# Patient Record
Sex: Female | Born: 1955
Health system: Southern US, Community
[De-identification: ages and names within clinical notes are randomized; demographics above are authoritative.]

## PROBLEM LIST (undated history)

## (undated) DIAGNOSIS — R87622 Low grade squamous intraepithelial lesion on cytologic smear of vagina (LGSIL): Secondary | ICD-10-CM

## (undated) DIAGNOSIS — N809 Endometriosis, unspecified: Secondary | ICD-10-CM

## (undated) DIAGNOSIS — N979 Female infertility, unspecified: Secondary | ICD-10-CM

## (undated) DIAGNOSIS — N3010 Interstitial cystitis (chronic) without hematuria: Secondary | ICD-10-CM

## (undated) DIAGNOSIS — F410 Panic disorder [episodic paroxysmal anxiety] without agoraphobia: Secondary | ICD-10-CM

## (undated) HISTORY — PX: DIAGNOSTIC LAPAROSCOPY: SUR761

## (undated) HISTORY — PX: OTHER SURGICAL HISTORY: SHX169

## (undated) HISTORY — DX: Female infertility, unspecified: N97.9

## (undated) HISTORY — DX: Interstitial cystitis (chronic) without hematuria: N30.10

## (undated) HISTORY — DX: Endometriosis, unspecified: N80.9

## (undated) HISTORY — DX: Low grade squamous intraepithelial lesion on cytologic smear of vagina (LGSIL): R87.622

## (undated) HISTORY — DX: Panic disorder (episodic paroxysmal anxiety): F41.0

---

## 1997-11-06 ENCOUNTER — Ambulatory Visit (HOSPITAL_COMMUNITY): Admission: RE | Admit: 1997-11-06 | Discharge: 1997-11-06 | Payer: Self-pay | Admitting: Obstetrics and Gynecology

## 1998-09-18 ENCOUNTER — Other Ambulatory Visit: Admission: RE | Admit: 1998-09-18 | Discharge: 1998-09-18 | Payer: Self-pay | Admitting: Obstetrics and Gynecology

## 1999-01-12 ENCOUNTER — Ambulatory Visit (HOSPITAL_COMMUNITY): Admission: RE | Admit: 1999-01-12 | Discharge: 1999-01-12 | Payer: Self-pay | Admitting: Obstetrics and Gynecology

## 1999-11-17 ENCOUNTER — Other Ambulatory Visit: Admission: RE | Admit: 1999-11-17 | Discharge: 1999-11-17 | Payer: Self-pay | Admitting: Obstetrics and Gynecology

## 2000-01-28 ENCOUNTER — Ambulatory Visit (HOSPITAL_COMMUNITY): Admission: RE | Admit: 2000-01-28 | Discharge: 2000-01-28 | Payer: Self-pay | Admitting: Obstetrics and Gynecology

## 2000-01-28 ENCOUNTER — Encounter: Payer: Self-pay | Admitting: Obstetrics and Gynecology

## 2000-11-01 ENCOUNTER — Other Ambulatory Visit: Admission: RE | Admit: 2000-11-01 | Discharge: 2000-11-01 | Payer: Self-pay | Admitting: Obstetrics and Gynecology

## 2001-03-30 ENCOUNTER — Ambulatory Visit (HOSPITAL_COMMUNITY): Admission: RE | Admit: 2001-03-30 | Discharge: 2001-03-30 | Payer: Self-pay | Admitting: Obstetrics and Gynecology

## 2001-03-30 ENCOUNTER — Encounter: Payer: Self-pay | Admitting: Obstetrics and Gynecology

## 2001-11-21 ENCOUNTER — Other Ambulatory Visit: Admission: RE | Admit: 2001-11-21 | Discharge: 2001-11-21 | Payer: Self-pay | Admitting: Obstetrics and Gynecology

## 2002-04-02 ENCOUNTER — Encounter: Payer: Self-pay | Admitting: Obstetrics and Gynecology

## 2002-04-02 ENCOUNTER — Ambulatory Visit (HOSPITAL_COMMUNITY): Admission: RE | Admit: 2002-04-02 | Discharge: 2002-04-02 | Payer: Self-pay | Admitting: Obstetrics and Gynecology

## 2003-04-04 ENCOUNTER — Other Ambulatory Visit: Admission: RE | Admit: 2003-04-04 | Discharge: 2003-04-04 | Payer: Self-pay | Admitting: Obstetrics and Gynecology

## 2003-05-09 ENCOUNTER — Encounter: Payer: Self-pay | Admitting: Obstetrics and Gynecology

## 2003-05-09 ENCOUNTER — Ambulatory Visit (HOSPITAL_COMMUNITY): Admission: RE | Admit: 2003-05-09 | Discharge: 2003-05-09 | Payer: Self-pay | Admitting: Obstetrics and Gynecology

## 2004-04-30 ENCOUNTER — Other Ambulatory Visit: Admission: RE | Admit: 2004-04-30 | Discharge: 2004-04-30 | Payer: Self-pay | Admitting: Obstetrics and Gynecology

## 2004-08-31 ENCOUNTER — Ambulatory Visit (HOSPITAL_COMMUNITY): Admission: RE | Admit: 2004-08-31 | Discharge: 2004-08-31 | Payer: Self-pay | Admitting: Obstetrics and Gynecology

## 2005-05-18 ENCOUNTER — Other Ambulatory Visit: Admission: RE | Admit: 2005-05-18 | Discharge: 2005-05-18 | Payer: Self-pay | Admitting: Obstetrics and Gynecology

## 2005-09-13 ENCOUNTER — Ambulatory Visit (HOSPITAL_COMMUNITY): Admission: RE | Admit: 2005-09-13 | Discharge: 2005-09-13 | Payer: Self-pay | Admitting: Obstetrics and Gynecology

## 2005-12-26 ENCOUNTER — Ambulatory Visit (HOSPITAL_COMMUNITY): Admission: RE | Admit: 2005-12-26 | Discharge: 2005-12-26 | Payer: Self-pay | Admitting: Gastroenterology

## 2005-12-26 ENCOUNTER — Encounter (INDEPENDENT_AMBULATORY_CARE_PROVIDER_SITE_OTHER): Payer: Self-pay | Admitting: Specialist

## 2006-08-16 ENCOUNTER — Other Ambulatory Visit: Admission: RE | Admit: 2006-08-16 | Discharge: 2006-08-16 | Payer: Self-pay | Admitting: Obstetrics and Gynecology

## 2006-09-14 ENCOUNTER — Ambulatory Visit (HOSPITAL_COMMUNITY): Admission: RE | Admit: 2006-09-14 | Discharge: 2006-09-14 | Payer: Self-pay | Admitting: Obstetrics and Gynecology

## 2007-08-28 ENCOUNTER — Other Ambulatory Visit: Admission: RE | Admit: 2007-08-28 | Discharge: 2007-08-28 | Payer: Self-pay | Admitting: Obstetrics and Gynecology

## 2007-09-17 ENCOUNTER — Ambulatory Visit (HOSPITAL_COMMUNITY): Admission: RE | Admit: 2007-09-17 | Discharge: 2007-09-17 | Payer: Self-pay | Admitting: Obstetrics and Gynecology

## 2008-09-03 ENCOUNTER — Other Ambulatory Visit: Admission: RE | Admit: 2008-09-03 | Discharge: 2008-09-03 | Payer: Self-pay | Admitting: Obstetrics and Gynecology

## 2008-09-18 ENCOUNTER — Ambulatory Visit (HOSPITAL_COMMUNITY): Admission: RE | Admit: 2008-09-18 | Discharge: 2008-09-18 | Payer: Self-pay | Admitting: Obstetrics and Gynecology

## 2009-09-21 ENCOUNTER — Ambulatory Visit (HOSPITAL_COMMUNITY): Admission: RE | Admit: 2009-09-21 | Discharge: 2009-09-21 | Payer: Self-pay | Admitting: Obstetrics and Gynecology

## 2010-10-06 ENCOUNTER — Ambulatory Visit (HOSPITAL_COMMUNITY)
Admission: RE | Admit: 2010-10-06 | Discharge: 2010-10-06 | Payer: Self-pay | Source: Home / Self Care | Attending: Obstetrics and Gynecology | Admitting: Obstetrics and Gynecology

## 2010-10-19 ENCOUNTER — Encounter
Admission: RE | Admit: 2010-10-19 | Discharge: 2010-10-19 | Payer: Self-pay | Source: Home / Self Care | Attending: Family Medicine | Admitting: Family Medicine

## 2011-02-16 ENCOUNTER — Ambulatory Visit
Admission: RE | Admit: 2011-02-16 | Discharge: 2011-02-16 | Disposition: A | Discharge status: Home / Self Care | Payer: BC Managed Care – PPO | Source: Ambulatory Visit | Attending: Family Medicine | Admitting: Family Medicine

## 2011-02-16 ENCOUNTER — Other Ambulatory Visit: Payer: Self-pay | Admitting: Family Medicine

## 2011-02-16 DIAGNOSIS — M79601 Pain in right arm: Secondary | ICD-10-CM

## 2011-02-18 NOTE — Op Note (Signed)
Brenda Ali, Brenda Ali                  ACCOUNT NO.:  [id number]   MEDICAL RECORD NO.:  [id number]          PATIENT TYPE:  AMB   LOCATION:  ENDO                         FACILITY:  [facility]   PHYSICIAN:  Anselmo Rod, M.D.  DATE OF BIRTH:  [birth year]   DATE OF PROCEDURE:  12/26/2005  DATE OF DISCHARGE:                                 OPERATIVE REPORT   PROCEDURE PERFORMED:  Colonoscopy with cold biopsies x 8.   ENDOSCOPIST:  Anselmo Rod, M.D.   INSTRUMENT USED:  Olympus video colonoscope.   INDICATIONS FOR PROCEDURE:  A 55 year old white female undergoing screening  colonoscopy; her father had sclerosing cholangitis.  There is no known  family history of colon cancer.   PREPROCEDURE PREPARATION:  Informed consent was procured from the patient.  The patient was fasted for four hours prior to the procedure after being  prepped with OsmoPrep pills the night of and the morning of the procedure.  The risks and benefits of the procedure including a 10% miss rate for cancer  or polyps was discussed with the patient as well.   PREPROCEDURE PHYSICAL:  The patient had stable vital signs.  Neck supple.  Chest clear to auscultation.  S1 and S2 regular.  Abdomen soft with normal  bowel sounds.   DESCRIPTION OF PROCEDURE:  The patient was placed in left lateral decubitus  position and sedated with 100 mcg of fentanyl and 10 mg of Versed in slow  incremental doses.  Once the patient was adequately sedated and maintained  on low flow oxygen and continuous cardiac monitoring, the Olympus video  colonoscope was advanced from the rectum to the cecum.  The appendicular  orifice and ileocecal valve were clearly visualized and photographed.  The  patient had a somewhat tortuous colon, and a small sessile polyp was removed  by cold biopsy x 1 from the rectosigmoid colon.  A prominent fold was  biopsied from 70 cm.  The rest of the exam was unremarkable.  Retroflexion  in the rectum revealed no  abnormalities.  The patient tolerated the  procedure well without complication.   IMPRESSION:  1.  Small polyp removed by cold biopsy from the rectosigmoid colon.  2.  Prominent fold biopsied from 70 cm.  3.  Tortuous colon but no other masses or polyps seen.   RECOMMENDATIONS:  1.  Await pathology results.  2.  Avoid all nonsteroidals including aspirin for the next two weeks.  3.  Repeat colonoscopy depending on pathology results.  4.  Outpatient followup as need arises in the future.      Anselmo Rod, M.D.  Electronically Signed     JNM/MEDQ  D:  12/26/2005  T:  12/27/2005  Job:  [id number]   cc:   Edwena Felty. Romine, M.D.  Fax: [phone]   Oley Balm Georgina Pillion, M.D.  Fax: [phone]

## 2011-02-24 ENCOUNTER — Other Ambulatory Visit: Payer: Self-pay | Admitting: Specialist

## 2011-02-24 DIAGNOSIS — M542 Cervicalgia: Secondary | ICD-10-CM

## 2011-02-25 ENCOUNTER — Ambulatory Visit
Admission: RE | Admit: 2011-02-25 | Discharge: 2011-02-25 | Disposition: A | Discharge status: Home / Self Care | Payer: BC Managed Care – PPO | Source: Ambulatory Visit | Attending: Specialist | Admitting: Specialist

## 2011-02-25 DIAGNOSIS — M542 Cervicalgia: Secondary | ICD-10-CM

## 2011-06-04 HISTORY — PX: CERVICAL FUSION: SHX112

## 2011-06-23 ENCOUNTER — Encounter (HOSPITAL_COMMUNITY)
Admission: RE | Admit: 2011-06-23 | Discharge: 2011-06-23 | Disposition: A | Discharge status: Home / Self Care | Payer: BC Managed Care – PPO | Source: Ambulatory Visit | Attending: Orthopedic Surgery | Admitting: Orthopedic Surgery

## 2011-06-23 ENCOUNTER — Other Ambulatory Visit (HOSPITAL_COMMUNITY): Payer: Self-pay | Admitting: Orthopedic Surgery

## 2011-06-23 DIAGNOSIS — M47812 Spondylosis without myelopathy or radiculopathy, cervical region: Secondary | ICD-10-CM

## 2011-06-23 LAB — SURGICAL PCR SCREEN
MRSA, PCR: POSITIVE — AB
Staphylococcus aureus: POSITIVE — AB

## 2011-06-23 LAB — CBC
Hemoglobin: 14.8 g/dL (ref 12.0–15.0)
MCH: 30.9 pg (ref 26.0–34.0)
MCHC: 34.7 g/dL (ref 30.0–36.0)
Platelets: 232 10*3/uL (ref 150–400)
RBC: 4.79 MIL/uL (ref 3.87–5.11)
RDW: 12.3 % (ref 11.5–15.5)
WBC: 8.2 10*3/uL (ref 4.0–10.5)

## 2011-06-29 ENCOUNTER — Ambulatory Visit (HOSPITAL_COMMUNITY): Payer: BC Managed Care – PPO

## 2011-06-29 ENCOUNTER — Ambulatory Visit (HOSPITAL_COMMUNITY)
Admission: RE | Admit: 2011-06-29 | Discharge: 2011-06-30 | Disposition: A | Discharge status: Home / Self Care | Payer: BC Managed Care – PPO | Source: Ambulatory Visit | Attending: Orthopedic Surgery | Admitting: Orthopedic Surgery

## 2011-06-29 DIAGNOSIS — Z01818 Encounter for other preprocedural examination: Secondary | ICD-10-CM | POA: Insufficient documentation

## 2011-06-29 DIAGNOSIS — M5020 Other cervical disc displacement, unspecified cervical region: Secondary | ICD-10-CM | POA: Insufficient documentation

## 2011-06-29 DIAGNOSIS — Z23 Encounter for immunization: Secondary | ICD-10-CM | POA: Insufficient documentation

## 2011-06-29 DIAGNOSIS — Z01812 Encounter for preprocedural laboratory examination: Secondary | ICD-10-CM | POA: Insufficient documentation

## 2011-07-06 NOTE — Op Note (Signed)
NAMEPAYSLEY, POPLAR NO.:  [id number]  MEDICAL RECORD NO.:  [id number]  LOCATION:  3536                         FACILITY:  [facility]  PHYSICIAN:  Alvy Beal, MD    DATE OF BIRTH:  May 20, 1956  DATE OF PROCEDURE:  06/29/2011 DATE OF DISCHARGE:                              OPERATIVE REPORT   PREOPERATIVE DIAGNOSIS:  C5-C6 disk herniation posterolateral to the right.  POSTOPERATIVE DIAGNOSIS:  C5-C6 disk herniation posterolateral to the right.  OPERATIVE PROCEDURE:  Anterior cervical diskectomy and fusion, C5-C6.  COMPLICATIONS:  None.  CONDITION:  Stable.  INSTRUMENTATION SYSTEM USED:  The Titan intervertebral Titanium cage 8 mm lordotic, medium, packed with DBX Mix and a 14-mm anterior cervical Synthes Vector plate with 40-JW screws into the body of C5 and 14-mm screws into the body of C6.  FIRST ASSISTANT:  Norval Gable, PA  COMPLICATIONS:  None.  HISTORY:  This is a very pleasant young woman who has been having significant progressive debilitating neck and radicular right arm pain. MRI and clinical evaluation are consistent with a C6 radiculopathy from a C5-C6 disk herniation.  Because of the failure of conservative management and the progressive deteriorating pain, she elected to proceed with surgery.  All appropriate risks, benefits, and alternatives were discussed with the patient and consent was obtained.  OPERATIVE NOTE:  The patient was brought to the operating room, placed supine on the operating table.  After successful induction of general anesthesia endotracheal intubation, TEDs and SCDs were applied.  The arms were tucked at the side and rolled towels were placed between the shoulder blades and the anterior cervical spine was prepped and draped in a standard fashion.  First assistant for the case is Norval Gable.  A time-out was then done to confirm patient, procedure, and all other pertinent important data.  Once this was completed,  fluoro was used to identify the C5-C6 disk space and it was marked.  A left-sided transverse incision was planned.  A skin incision was infiltrated with 0.25% Marcaine with epinephrine and a 3-inch incision was made centered over the C5-C6 disk space going to the left.  Sharp dissection was carried out down to the deep fascia.  The platysma was sharply incised and divided with Bovie.  I then dissected along the medial border of the sternocleidomastoid sharply and then eventually identified the omohyoid and then swept the trachea and esophagus medially and then identified and protected the carotid sheath laterally with a finger.  I then used a Pension scheme manager to remove the prevertebral fascia to completely expose the anterior cervical spine.  A needle was placed into the C5-C6 disk space.  Intraoperative x-ray confirmed that we were at the appropriate level.  Once this was done, I then used bipolar electrocautery to mobilize the longus coli muscles out to the level of the uncovertebral joints bilaterally.  Once this was done, I then placed the retracting blades underneath the longus coli muscle, deflated the endotracheal cuff, and expanded the retractors.  At this point, I clearly could visualize the C5-C6 disk space.  An annulotomy was performed with a 15 blade scalpel and then a pituitary rongeurs  used to debulk the anterior third of the disk material.  I then used a 2 and 3-mm Kerrison to trim down the overhanging osteophytes from the body of C5.  At this point, distraction pins were placed in the C5- C6 disk space and I continued my diskectomy.  Once I had the bulk of the disk material out, I was able to visualize the disk herniation posterolateral to the right.  Using a nerve hook and a 1-mm Kerrison, I teased this fragment out and removed it.  There was also a small calcific hard disk which I trimmed down.  There was a portion of it that was still quite adherent to the thecal sac.   I also undercut the uncovertebral joint to remove the bone spur with a 1-mm Kerrison.  At this point, I had adequate decompression.  I could freely pass my nerve hook underneath the endplates of C6 and C5 and there was no undue tension.  I irrigated copiously with normal saline.  I rasped the endplates and then trialed with a 7 and 8-mm medium lordotic Titan cage. The 8 gave a better fit and so, I elected to use this.  It was packed with DBX Mix and malleted to the appropriate depth.  The distraction pins were removed and then I attached the plate to the anterior spine. Self-drilling screws were used.  At this point, the wound was copiously irrigated with normal saline and used bipolar electrocautery to obtain hemostasis and maintained it with FloSeal.  I then checked to make sure the esophagus was not trapped beneath the plate which it was not and I returned the trachea and esophagus to midline.  With hemostasis achieved, I closed the platysma with interrupted 2-0 Vicryl sutures and 3-0 Monocryl for the skin.  At the end of the case, all needle and sponge counts were correct.  There were no adverse intraoperative events.  First assistant was Norval Gable, my PA.  She was instrumental in assisting with the closure, retraction, and assisting me with removing disk material with the pituitary rongeur.     Alvy Beal, MD     DDB/MEDQ  D:  06/29/2011  T:  06/29/2011  Job:  [id number]  Electronically Signed by Venita Lick MD on 07/06/2011 08:18:18 AM

## 2011-09-21 ENCOUNTER — Other Ambulatory Visit: Payer: Self-pay | Admitting: Obstetrics and Gynecology

## 2011-09-21 DIAGNOSIS — Z1231 Encounter for screening mammogram for malignant neoplasm of breast: Secondary | ICD-10-CM

## 2011-10-25 ENCOUNTER — Ambulatory Visit (HOSPITAL_COMMUNITY)
Admission: RE | Admit: 2011-10-25 | Discharge: 2011-10-25 | Disposition: A | Discharge status: Home / Self Care | Payer: BC Managed Care – PPO | Source: Ambulatory Visit | Attending: Obstetrics and Gynecology | Admitting: Obstetrics and Gynecology

## 2011-10-25 DIAGNOSIS — Z1231 Encounter for screening mammogram for malignant neoplasm of breast: Secondary | ICD-10-CM | POA: Insufficient documentation

## 2011-10-26 DIAGNOSIS — R87622 Low grade squamous intraepithelial lesion on cytologic smear of vagina (LGSIL): Secondary | ICD-10-CM

## 2011-10-26 HISTORY — DX: Low grade squamous intraepithelial lesion on cytologic smear of vagina (LGSIL): R87.622

## 2011-11-16 HISTORY — PX: COLPOSCOPY: SHX161

## 2012-10-15 ENCOUNTER — Other Ambulatory Visit: Payer: Self-pay | Admitting: Obstetrics and Gynecology

## 2012-10-15 DIAGNOSIS — Z1231 Encounter for screening mammogram for malignant neoplasm of breast: Secondary | ICD-10-CM

## 2012-11-01 ENCOUNTER — Ambulatory Visit (HOSPITAL_COMMUNITY)
Admission: RE | Admit: 2012-11-01 | Discharge: 2012-11-01 | Disposition: A | Discharge status: Home / Self Care | Payer: Managed Care, Other (non HMO) | Source: Ambulatory Visit | Attending: Obstetrics and Gynecology | Admitting: Obstetrics and Gynecology

## 2012-11-01 DIAGNOSIS — Z1231 Encounter for screening mammogram for malignant neoplasm of breast: Secondary | ICD-10-CM | POA: Insufficient documentation

## 2013-08-05 ENCOUNTER — Telehealth: Payer: Self-pay | Admitting: Obstetrics and Gynecology

## 2013-08-05 NOTE — Telephone Encounter (Signed)
Patient has a question about billing. Okay to leave a message. Patient has seen Dr. Tresa Res for years and saw Dr. Tresa Res in February. Patient was seen in [place] on 11/16/12. She does not understand why she is just now receiving a bill.

## 2013-11-13 ENCOUNTER — Other Ambulatory Visit (HOSPITAL_COMMUNITY): Payer: Self-pay | Admitting: Family Medicine

## 2013-11-13 DIAGNOSIS — Z1231 Encounter for screening mammogram for malignant neoplasm of breast: Secondary | ICD-10-CM

## 2013-11-25 ENCOUNTER — Ambulatory Visit (HOSPITAL_COMMUNITY)
Admission: RE | Admit: 2013-11-25 | Discharge: 2013-11-25 | Disposition: A | Discharge status: Home / Self Care | Payer: BC Managed Care – PPO | Source: Ambulatory Visit | Attending: Family Medicine | Admitting: Family Medicine

## 2013-11-25 DIAGNOSIS — Z1231 Encounter for screening mammogram for malignant neoplasm of breast: Secondary | ICD-10-CM | POA: Insufficient documentation

## 2013-11-26 ENCOUNTER — Encounter: Payer: Self-pay | Admitting: Obstetrics and Gynecology

## 2013-11-28 ENCOUNTER — Telehealth: Payer: Self-pay | Admitting: Gynecology

## 2013-11-28 NOTE — Telephone Encounter (Signed)
Called patient and left message to cancel her appointment for an AEX tomorrow morning due to inclement weather. Please call patient back to reschedule. I also let her know we are not opening until 10:00 AM tomorrow.

## 2013-11-29 ENCOUNTER — Encounter: Payer: Self-pay | Admitting: Gynecology

## 2013-11-29 ENCOUNTER — Ambulatory Visit (INDEPENDENT_AMBULATORY_CARE_PROVIDER_SITE_OTHER): Payer: BC Managed Care – PPO | Admitting: Gynecology

## 2013-11-29 ENCOUNTER — Ambulatory Visit: Payer: Self-pay | Admitting: Gynecology

## 2013-11-29 ENCOUNTER — Ambulatory Visit: Payer: Self-pay | Admitting: Obstetrics and Gynecology

## 2013-11-29 VITALS — BP 124/82 | HR 75 | Resp 16 | Ht 61.75 in | Wt 127.0 lb

## 2013-11-29 DIAGNOSIS — Z0000 Encounter for general adult medical examination without abnormal findings: Secondary | ICD-10-CM

## 2013-11-29 DIAGNOSIS — Z01419 Encounter for gynecological examination (general) (routine) without abnormal findings: Secondary | ICD-10-CM

## 2013-11-29 DIAGNOSIS — Z79890 Hormone replacement therapy (postmenopausal): Secondary | ICD-10-CM

## 2013-11-29 LAB — POCT URINALYSIS DIPSTICK
Bilirubin, UA: NEGATIVE
GLUCOSE UA: NEGATIVE
KETONES UA: NEGATIVE
Leukocytes, UA: NEGATIVE
Nitrite, UA: NEGATIVE
PH UA: 5
PROTEIN UA: NEGATIVE
RBC UA: NEGATIVE
UROBILINOGEN UA: NEGATIVE

## 2013-11-29 MED ORDER — ESTRADIOL 0.05 MG/24HR TD PTTW: 1.0000 | MEDICATED_PATCH | TRANSDERMAL | Status: DC

## 2013-11-29 MED ORDER — PROGESTERONE MICRONIZED 100 MG PO CAPS: 100.0000 mg | ORAL_CAPSULE | Freq: Every day | ORAL | Status: DC

## 2013-11-29 NOTE — Progress Notes (Signed)
58 y.o. married Caucasian female  G2P1001. here for annual exam. Pt is currently sexually active.  Pt is on HRT and does not have monthly bleeding.  Very happy and would like to continue.  Patient's last menstrual period was 10/13/2011.          Sexually active: yes  The current method of family planning is menopause.    Exercising: yes  tennis,walking & gym Last pap: 11-14-12 neg HPV HR neg Alcohol: 4 a week Tobacco: none BSE: yes Mammogram:  11/2013-BiRADS 1 DEXA:  never Colonoscopy:  2007   Health Maintenance  Topic Date Due  . Pap Smear  10/21/1972  . Tetanus/tdap  10/21/1973  . Colonoscopy  10/21/2004  . Influenza Vaccine  05/03/2013  . Mammogram  11/26/2015    Family History  Problem Relation Age of Onset  . Endometrial cancer Sister 60    There are no active problems to display for this patient.   Past Medical History  Diagnosis Date  . Infertility, female   . Endometriosis     Adherent R Ovary  . Panic disorder   . Interstitial cystitis   . LGSIL Pap smear of vagina 10/26/11    HPV Negative; Colpo Negative    Past Surgical History  Procedure Laterality Date  . Diagnostic laparoscopy    . Cervical fusion  06/2011  . Colposcopy  11/16/11    Due to LGSIL - HPV;  benign    Allergies: Sulfa antibiotics  Current Outpatient Prescriptions  Medication Sig Dispense Refill  . CLONAZEPAM PO Take by mouth.      . estradiol (VIVELLE-DOT) 0.05 MG/24HR patch Place 1 patch onto the skin 2 (two) times a week.      . IMIPRAMINE HCL PO Take by mouth.      . progesterone (PROMETRIUM) 100 MG capsule Take 100 mg by mouth daily.       No current facility-administered medications for this visit.    ROS: Pertinent items are noted in HPI.  Exam:    Ht 5' 1.75" (1.568 m)  Wt 127 lb (57.607 kg)  BMI 23.43 kg/m2  LMP 10/13/2011 Weight change: @WEIGHTCHANGE @ Last 3 height recordings:  Ht Readings from Last 3 Encounters:  11/29/13 5' 1.75" (1.568 m)   General appearance:  alert, cooperative and appears stated age Head: Normocephalic, without obvious abnormality, atraumatic Neck: no adenopathy, no carotid bruit, no JVD, supple, symmetrical, trachea midline and thyroid not enlarged, symmetric, no tenderness/mass/nodules Lungs: clear to auscultation bilaterally Breasts: normal appearance, no masses or tenderness Heart: regular rate and rhythm, S1, S2 normal, no murmur, click, rub or gallop Abdomen: soft, non-tender; bowel sounds normal; no masses,  no organomegaly Extremities: extremities normal, atraumatic, no cyanosis or edema Skin: Skin color, texture, turgor normal. No rashes or lesions Lymph nodes: Cervical, supraclavicular, and axillary nodes normal. no inguinal nodes palpated Neurologic: Grossly normal   Pelvic: External genitalia:  no lesions              Urethra: normal appearing urethra with no masses, tenderness or lesions              Bartholins and Skenes: normal                 Vagina: normal appearing vagina with normal color and discharge, no lesions              Cervix: normal appearance              Pap taken: no  Bimanual Exam:  Uterus:  uterus is normal size, shape, consistency and nontender                                      Adnexa:    normal adnexa in size, nontender and no masses                                      Rectovaginal: Confirms                                      Anus:  normal sphincter tone, no lesions  A: well woman no contraindication to continue hormonal therapy Contraceptive management     P: mammogram annually pap smear guidelines reviewed Refill HRT Pt will return for fasting labs counseled on breast self exam, mammography screening, use and side effects of HRT, menopause, adequate intake of calcium and vitamin D, diet and exercise return annually or prn   An After Visit Summary was printed and given to the patient.

## 2013-11-29 NOTE — Patient Instructions (Signed)

## 2013-12-02 LAB — HEMOGLOBIN, FINGERSTICK: HEMOGLOBIN, FINGERSTICK: 15.2 g/dL (ref 12.0–16.0)

## 2013-12-06 ENCOUNTER — Other Ambulatory Visit (INDEPENDENT_AMBULATORY_CARE_PROVIDER_SITE_OTHER): Payer: BC Managed Care – PPO

## 2013-12-06 DIAGNOSIS — Z0000 Encounter for general adult medical examination without abnormal findings: Secondary | ICD-10-CM

## 2013-12-06 LAB — COMPREHENSIVE METABOLIC PANEL
ALK PHOS: 69 U/L (ref 39–117)
ALT: 12 U/L (ref 0–35)
AST: 19 U/L (ref 0–37)
Albumin: 4.7 g/dL (ref 3.5–5.2)
BUN: 19 mg/dL (ref 6–23)
CO2: 28 mEq/L (ref 19–32)
Calcium: 9.7 mg/dL (ref 8.4–10.5)
Chloride: 101 mEq/L (ref 96–112)
Creat: 0.87 mg/dL (ref 0.50–1.10)
GLUCOSE: 102 mg/dL — AB (ref 70–99)
Potassium: 4.2 mEq/L (ref 3.5–5.3)
SODIUM: 138 meq/L (ref 135–145)
Total Bilirubin: 0.5 mg/dL (ref 0.2–1.2)
Total Protein: 7.2 g/dL (ref 6.0–8.3)

## 2013-12-06 LAB — LIPID PANEL
CHOL/HDL RATIO: 2.5 ratio
CHOLESTEROL: 205 mg/dL — AB (ref 0–200)
HDL: 82 mg/dL (ref >39)
LDL CALC: 112 mg/dL — AB (ref 0–99)
Triglycerides: 56 mg/dL (ref <150)
VLDL: 11 mg/dL (ref 0–40)

## 2013-12-07 LAB — VITAMIN D 25 HYDROXY (VIT D DEFICIENCY, FRACTURES): Vit D, 25-Hydroxy: 38 ng/mL (ref 30–89)

## 2013-12-18 ENCOUNTER — Telehealth: Payer: Self-pay | Admitting: *Deleted

## 2013-12-18 NOTE — Telephone Encounter (Signed)
Notified patient that her 2014 Health Provider Form has been faxed to [facility]. # [phone]  Routed to provider, encounter closed.

## 2014-08-04 ENCOUNTER — Encounter: Payer: Self-pay | Admitting: Gynecology

## 2014-10-14 ENCOUNTER — Other Ambulatory Visit: Payer: Self-pay | Admitting: Family Medicine

## 2014-10-14 DIAGNOSIS — N63 Unspecified lump in unspecified breast: Secondary | ICD-10-CM

## 2014-10-15 ENCOUNTER — Telehealth: Payer: Self-pay | Admitting: Obstetrics and Gynecology

## 2014-10-15 NOTE — Telephone Encounter (Signed)
Spoke with patient. She states she feels a change in the skin around her R breast. States area feels tough.  Noticed area about one week ago.  No redness, swelling or pain. No animal bites or trauma.   Patient declines office visit until 10/23/14. She is going out of town for birthday trip and does not want to be seen until 10/23/14. Patient is prior patient of Dr. Charlies Constable.   Patient scheduled for breast check with Regina Eck CNM for 10/23/14 at 0915. She is advised to call if any change in symptoms or would like earlier appointment.   Routing to provider for final review. Patient agreeable to disposition. Will close encounter

## 2014-10-15 NOTE — Telephone Encounter (Signed)
Patient calling to report she has "a breast mass" in her right breast. Patient hopes to come in for an appointment "sometime next week" due to going out of town for her birthday this weekend.

## 2014-10-23 ENCOUNTER — Ambulatory Visit (INDEPENDENT_AMBULATORY_CARE_PROVIDER_SITE_OTHER): Payer: BLUE CROSS/BLUE SHIELD | Admitting: Certified Nurse Midwife

## 2014-10-23 ENCOUNTER — Encounter: Payer: Self-pay | Admitting: Certified Nurse Midwife

## 2014-10-23 ENCOUNTER — Other Ambulatory Visit: Payer: Self-pay | Admitting: Certified Nurse Midwife

## 2014-10-23 VITALS — BP 124/74 | HR 78 | Resp 14 | Ht 61.75 in | Wt 122.4 lb

## 2014-10-23 DIAGNOSIS — N6310 Unspecified lump in the right breast, unspecified quadrant: Secondary | ICD-10-CM

## 2014-10-23 DIAGNOSIS — N63 Unspecified lump in unspecified breast: Secondary | ICD-10-CM

## 2014-10-23 NOTE — Progress Notes (Signed)
   Subjective:   59 y.o. MarriedCaucasian female presents for evaluation of right breast mass. Onset of the symptoms was one week. Patient sought evaluation because of fullness and size change.  Contributing factors include none. Denies fever, pain, nipple discharge but breasts feel heavy. Patient denies history of trauma, bites, or injuries. Last mammogram was 2/15.Marland Kitchen  Previous evaluation has included no workup   Review of Systems Pertinent items are noted in HPI.   Objective:   General appearance: alert, cooperative and appears stated age Breasts: normal appearance, no masses or tenderness, No nipple retraction or dimpling, No nipple discharge or bleeding, No axillary or supraclavicular adenopathy, left breast, right breast no dimpling or skin change or nipple discharge, firn ? cystic area  noted at 11 to 12 o'clock 2 fb from areola , non tender, no redness     Assessment:   ASSESSMENT:Patient is diagnosed with breast mass right   Plan:   PLAN: Discussed with patient need for evaluation with diagnostic mammogram and [place]. Will schedule.

## 2014-10-23 NOTE — Progress Notes (Signed)
Patient scheduled for right breast diagnostic and ultrasound imaging at the [facility] while in office. Appointment scheduled for 10/29/14 at 7:30am. Agreeable to date and time. Placed in mammogram hold.

## 2014-10-26 NOTE — Progress Notes (Signed)
Reviewed personally.  M. Suzanne Keanen Dohse, MD.  

## 2014-10-28 ENCOUNTER — Other Ambulatory Visit: Payer: Self-pay | Admitting: Certified Nurse Midwife

## 2014-10-28 ENCOUNTER — Other Ambulatory Visit: Payer: Self-pay

## 2014-10-28 DIAGNOSIS — N63 Unspecified lump in unspecified breast: Secondary | ICD-10-CM

## 2014-10-29 ENCOUNTER — Ambulatory Visit
Admission: RE | Admit: 2014-10-29 | Discharge: 2014-10-29 | Disposition: A | Discharge status: Home / Self Care | Payer: BLUE CROSS/BLUE SHIELD | Source: Ambulatory Visit | Attending: Certified Nurse Midwife | Admitting: Certified Nurse Midwife

## 2014-10-29 ENCOUNTER — Other Ambulatory Visit: Payer: Self-pay | Admitting: Certified Nurse Midwife

## 2014-10-29 DIAGNOSIS — N63 Unspecified lump in unspecified breast: Secondary | ICD-10-CM

## 2014-11-07 ENCOUNTER — Other Ambulatory Visit: Payer: Self-pay | Admitting: *Deleted

## 2014-11-07 DIAGNOSIS — Z79890 Hormone replacement therapy (postmenopausal): Secondary | ICD-10-CM

## 2014-11-07 MED ORDER — ESTRADIOL 0.05 MG/24HR TD PTTW: 1.0000 | MEDICATED_PATCH | TRANSDERMAL | Status: DC

## 2014-11-07 NOTE — Telephone Encounter (Signed)
Incoming fax from [facility] requesting Minivelle 0.05 mg Patch  Medication refill request: Minivelle Last AEX:  11/29/13 Next AEX: 11/20/14 Last MMG (if hormonal medication request): US Breast Axilla right BIRADS1:neg Refill authorized: 11/29/13 #24patch/3R. Today #8patch/0R?

## 2014-11-07 NOTE — Telephone Encounter (Addendum)
Left detailed message for patient at [phone]. Okay per ROI. Advised patient that Regina Eck CNM has sent a refill for [place] #[phone] to pharmacy on file. Advised to return call to office with any questions.  Cc: Emelia Salisbury  Routing to provider for final review. Patient agreeable to disposition. Will close encounter

## 2014-11-10 ENCOUNTER — Other Ambulatory Visit: Payer: Self-pay

## 2014-11-10 DIAGNOSIS — Z1231 Encounter for screening mammogram for malignant neoplasm of breast: Secondary | ICD-10-CM

## 2014-11-20 ENCOUNTER — Ambulatory Visit (INDEPENDENT_AMBULATORY_CARE_PROVIDER_SITE_OTHER): Payer: BLUE CROSS/BLUE SHIELD | Admitting: Certified Nurse Midwife

## 2014-11-20 ENCOUNTER — Encounter: Payer: Self-pay | Admitting: Certified Nurse Midwife

## 2014-11-20 VITALS — BP 94/66 | HR 80 | Ht 62.0 in | Wt 121.0 lb

## 2014-11-20 DIAGNOSIS — Z23 Encounter for immunization: Secondary | ICD-10-CM

## 2014-11-20 DIAGNOSIS — Z79890 Hormone replacement therapy (postmenopausal): Secondary | ICD-10-CM

## 2014-11-20 DIAGNOSIS — Z0000 Encounter for general adult medical examination without abnormal findings: Secondary | ICD-10-CM

## 2014-11-20 DIAGNOSIS — Z01419 Encounter for gynecological examination (general) (routine) without abnormal findings: Secondary | ICD-10-CM

## 2014-11-20 DIAGNOSIS — Z124 Encounter for screening for malignant neoplasm of cervix: Secondary | ICD-10-CM

## 2014-11-20 LAB — POCT URINALYSIS DIPSTICK
Bilirubin, UA: NEGATIVE
Glucose, UA: NEGATIVE
Ketones, UA: NEGATIVE
Leukocytes, UA: NEGATIVE
Nitrite, UA: NEGATIVE
PROTEIN UA: NEGATIVE
RBC UA: NEGATIVE
Urobilinogen, UA: NEGATIVE
pH, UA: 5

## 2014-11-20 MED ORDER — ESTRADIOL 0.05 MG/24HR TD PTTW: 1.0000 | MEDICATED_PATCH | TRANSDERMAL | Status: DC

## 2014-11-20 MED ORDER — PROGESTERONE MICRONIZED 100 MG PO CAPS: 100.0000 mg | ORAL_CAPSULE | Freq: Every day | ORAL | Status: DC

## 2014-11-20 NOTE — Progress Notes (Signed)
Patient ID: Brenda Ali, female   DOB: [birth year], 59 y.o.   MRN: [id number] 59 y.o. G60P1011 Married  Caucasian Fe here for annual exam.  Menopausal on HRT. Denies vaginal bleeding or vaginal dryness. Next month has appointment with PCP Dr. Cheron Schaumann for labs and aex. Had breast mass evaluation which showed only fibroglandular changes, patient cannot feel area now and has 3 D follow up in 2 weeks. No health concerns today.  Patient's last menstrual period was 10/13/2011.  Pt is post menopausal.      Sexually active: Yes.    The current method of family planning is post menopausal status.    Exercising: Yes.    Pt plays tennis, goes to the gym and does fast walking Smoker:  no  Health Maintenance: Pap:  11/14/12, negative with neg HR HPV, previous LGSIL with neg HR HPV in 2013 MMG:  10/29/13, right diagnostic with ultrasound, Bi-Rads 1:  Negative, routine bilateral screening scheduled for 12/04/14 Colonoscopy:  12/2005, hyperplastic polyp, repeat in 10 years BMD:   Never  TDaP:  2004 Labs:  HB:  13.7  Urine:  Negative    reports that she has never smoked. She does not have any smokeless tobacco history on file. She reports that she drinks about 2.0 oz of alcohol per week. She reports that she does not use illicit drugs.  Past Medical History  Diagnosis Date  . Infertility, female   . Endometriosis     Adherent R Ovary  . Panic disorder   . Interstitial cystitis   . LGSIL Pap smear of vagina 10/26/11    HPV Negative; Colpo Negative    Past Surgical History  Procedure Laterality Date  . Diagnostic laparoscopy    . Cervical fusion  06/2011  . Colposcopy  11/16/11    Due to LGSIL - HPV;  benign    Current Outpatient Prescriptions  Medication Sig Dispense Refill  . clonazePAM (KLONOPIN) 0.5 MG tablet daily.  0  . CLONAZEPAM PO Take by mouth daily.     Marland Kitchen estradiol (MINIVELLE) 0.05 MG/24HR patch Place 1 patch (0.05 mg total) onto the skin 2 (two) times a week. 8 patch 0  . imipramine  (TOFRANIL) 25 MG tablet daily.  0  . IMIPRAMINE HCL PO Take by mouth daily.     . progesterone (PROMETRIUM) 100 MG capsule Take 1 capsule (100 mg total) by mouth daily. 90 capsule 3   No current facility-administered medications for this visit.    Family History  Problem Relation Age of Onset  . Endometrial cancer Sister 69    ROS:  Pertinent items are noted in HPI.  Otherwise, a comprehensive ROS was negative.  Exam:   BP 94/66 mmHg  Pulse 80  Ht 5\' 2"  (1.575 m)  Wt 121 lb (54.885 kg)  BMI 22.13 kg/m2  LMP 10/13/2011 Height: 5\' 2"  (157.5 cm) Ht Readings from Last 3 Encounters:  11/20/14 5\' 2"  (1.575 m)  10/23/14 5' 1.75" (1.568 m)  11/29/13 5' 1.75" (1.568 m)    General appearance: alert, cooperative and appears stated age Head: Normocephalic, without obvious abnormality, atraumatic Neck: no adenopathy, supple, symmetrical, trachea midline and thyroid normal to inspection and palpation Lungs: clear to auscultation bilaterally Breasts: normal appearance, no masses or tenderness, No nipple retraction or dimpling, No nipple discharge or bleeding, No axillary or supraclavicular adenopathy RIght breast area of concern on 10/23/14 is not palpable now, found to fibroglandular tissue Heart: regular rate and rhythm Abdomen: soft, non-tender; no masses,  no organomegaly Extremities: extremities normal, atraumatic, no cyanosis or edema Skin: Skin color, texture, turgor normal. No rashes or lesions Lymph nodes: Cervical, supraclavicular, and axillary nodes normal. No abnormal inguinal nodes palpated Neurologic: Grossly normal   Pelvic: External genitalia:  no lesions              Urethra:  normal appearing urethra with no masses, tenderness or lesions              Bartholin's and Skene's: normal                 Vagina: normal appearing vagina with normal color and discharge, no lesions              Cervix: normal,non tender, no lesions              Pap taken: Yes.   Bimanual Exam:   Uterus:  normal size, contour, position, consistency, mobility, non-tender              Adnexa: normal adnexa and no mass, fullness, tenderness               Rectovaginal: Confirms               Anus:  normal sphincter tone, no lesions  Chaperone present:  yes  A:  Well Woman with normal exam  Menopausal on HRT requests continuance for hot flashes  Recent evaluation for right breast mass, not palpable today, benign finding has follow up 3 D mammogram in 2 weeks scheduled  Immunization update    P:   Reviewed health and wellness pertinent to exam  Discussed with patient that will need to work on decreasing HRT dosage beginning next year to be able to stop by age 59 to decrease cardiovascular risk concerns.  Rx Minivelle 0.05 mg see order  Rx Prometrium 100 mg see order  Lab Vitamin D  Requests TDAP  Pap smear taken today with HPV reflex   counseled on breast self exam, mammography screening, use and side effects of HRT, adequate intake of calcium and vitamin D, diet and exercise  return annually or prn  An After Visit Summary was printed and given to the patient.

## 2014-11-20 NOTE — Patient Instructions (Signed)

## 2014-11-21 LAB — VITAMIN D 25 HYDROXY (VIT D DEFICIENCY, FRACTURES): VIT D 25 HYDROXY: 21 ng/mL — AB (ref 30–100)

## 2014-11-21 LAB — IPS PAP TEST WITH REFLEX TO HPV

## 2014-11-23 NOTE — Progress Notes (Signed)
Reviewed personally.  M. Suzanne Mike Hamre, MD.  

## 2014-12-02 NOTE — Addendum Note (Signed)
Addended by: Regina Eck on: 12/02/2014 09:01 AM   Modules accepted: Orders, SmartSet

## 2014-12-04 ENCOUNTER — Ambulatory Visit
Admission: RE | Admit: 2014-12-04 | Discharge: 2014-12-04 | Disposition: A | Discharge status: Home / Self Care | Payer: BLUE CROSS/BLUE SHIELD | Source: Ambulatory Visit

## 2014-12-04 DIAGNOSIS — Z1231 Encounter for screening mammogram for malignant neoplasm of breast: Secondary | ICD-10-CM

## 2014-12-04 LAB — HEMOGLOBIN, FINGERSTICK: HEMOGLOBIN, FINGERSTICK: 13.7 g/dL (ref 12.0–16.0)

## 2014-12-08 LAB — HEMOGLOBIN, FINGERSTICK: Hemoglobin, fingerstick: 13.7 g/dL (ref 12.0–16.0)

## 2015-10-26 ENCOUNTER — Telehealth: Payer: Self-pay | Admitting: Certified Nurse Midwife

## 2015-10-26 NOTE — Telephone Encounter (Signed)
Patient cancelled medication consult because she is fine and will see you at aex appointment in March.

## 2015-10-29 ENCOUNTER — Ambulatory Visit: Payer: BLUE CROSS/BLUE SHIELD | Admitting: Certified Nurse Midwife

## 2015-12-03 ENCOUNTER — Ambulatory Visit: Payer: BLUE CROSS/BLUE SHIELD | Admitting: Certified Nurse Midwife

## 2015-12-05 ENCOUNTER — Other Ambulatory Visit: Payer: Self-pay | Admitting: Certified Nurse Midwife

## 2015-12-07 NOTE — Telephone Encounter (Signed)
Medication refill request: Progesterone/Estradiol  Last AEX:  11-20-14 Next AEX: 12-17-15 Last MMG (if hormonal medication request): 12-04-14 WNL Reill authorized: please advise

## 2015-12-17 ENCOUNTER — Ambulatory Visit (INDEPENDENT_AMBULATORY_CARE_PROVIDER_SITE_OTHER): Payer: BLUE CROSS/BLUE SHIELD | Admitting: Certified Nurse Midwife

## 2015-12-17 ENCOUNTER — Encounter: Payer: Self-pay | Admitting: Certified Nurse Midwife

## 2015-12-17 VITALS — BP 102/62 | HR 68 | Resp 16 | Ht 61.75 in | Wt 120.0 lb

## 2015-12-17 DIAGNOSIS — N950 Postmenopausal bleeding: Secondary | ICD-10-CM

## 2015-12-17 DIAGNOSIS — Z0000 Encounter for general adult medical examination without abnormal findings: Secondary | ICD-10-CM

## 2015-12-17 DIAGNOSIS — Z01419 Encounter for gynecological examination (general) (routine) without abnormal findings: Secondary | ICD-10-CM

## 2015-12-17 DIAGNOSIS — Z124 Encounter for screening for malignant neoplasm of cervix: Secondary | ICD-10-CM | POA: Diagnosis not present

## 2015-12-17 DIAGNOSIS — N852 Hypertrophy of uterus: Secondary | ICD-10-CM | POA: Diagnosis not present

## 2015-12-17 DIAGNOSIS — Z1211 Encounter for screening for malignant neoplasm of colon: Secondary | ICD-10-CM

## 2015-12-17 LAB — LIPID PANEL
CHOLESTEROL: 174 mg/dL (ref 125–200)
HDL: 53 mg/dL (ref >=46)
LDL Cholesterol: 100 mg/dL (ref <130)
Total CHOL/HDL Ratio: 3.3 Ratio (ref <=5.0)
Triglycerides: 107 mg/dL (ref <150)
VLDL: 21 mg/dL (ref <30)

## 2015-12-17 LAB — POCT URINALYSIS DIPSTICK
Bilirubin, UA: NEGATIVE
GLUCOSE UA: NEGATIVE
Ketones, UA: NEGATIVE
Leukocytes, UA: NEGATIVE
Nitrite, UA: NEGATIVE
PROTEIN UA: NEGATIVE
RBC UA: NEGATIVE
UROBILINOGEN UA: NEGATIVE
pH, UA: 5

## 2015-12-17 LAB — COMPREHENSIVE METABOLIC PANEL
ALBUMIN: 4.5 g/dL (ref 3.6–5.1)
ALT: 15 U/L (ref 6–29)
AST: 17 U/L (ref 10–35)
Alkaline Phosphatase: 51 U/L (ref 33–130)
BILIRUBIN TOTAL: 0.7 mg/dL (ref 0.2–1.2)
BUN: 18 mg/dL (ref 7–25)
CO2: 27 mmol/L (ref 20–31)
Calcium: 9.5 mg/dL (ref 8.6–10.4)
Chloride: 104 mmol/L (ref 98–110)
Creat: 0.98 mg/dL (ref 0.50–0.99)
Glucose, Bld: 93 mg/dL (ref 65–99)
POTASSIUM: 4.9 mmol/L (ref 3.5–5.3)
Sodium: 140 mmol/L (ref 135–146)
TOTAL PROTEIN: 7.3 g/dL (ref 6.1–8.1)

## 2015-12-17 NOTE — Progress Notes (Signed)
60 y.o. G77P1011 Married  Caucasian Fe here for annual exam. Menopausal on HRT had bleeding similar to a period dark brown for 7 days. Also had bleeding last fall but 3 days only. Had bleeding with HRT several years ago and endo biopsy with Dr Joan Flores, all normal. Sees PCP for aex, labs and medication management. Has 2 weddings coming up soon with her sons, so very busy! No other health issues today.  Patient's last menstrual period was 10/13/2011. had spotting for 1/17          Sexually active: Yes.    The current method of family planning is post menopausal status.    Exercising: Yes.    walk,gym,tennis Smoker:  no  Health Maintenance: Pap:  11-20-14 neg previous LGSIL 2013 MMG:  12-04-14 category c density,birads 1:neg  Has scheduled Colonoscopy:  12/2005,hyperplastic polyp, f/u 80yrs has scheduled BMD:   None will schedule with mammogram TDaP:  2016 Shingles: no Pneumonia: no Hep C and HIV: declines Labs: poct urine-neg Self breast exam: done monthly   reports that she has never smoked. She does not have any smokeless tobacco history on file. She reports that she drinks about 0.6 - 1.2 oz of alcohol per week. She reports that she does not use illicit drugs.  Past Medical History  Diagnosis Date  . Infertility, female   . Endometriosis     Adherent R Ovary  . Panic disorder   . Interstitial cystitis   . LGSIL Pap smear of vagina 10/26/11    HPV Negative; Colpo Negative    Past Surgical History  Procedure Laterality Date  . Diagnostic laparoscopy    . Cervical fusion  06/2011  . Colposcopy  11/16/11    Due to LGSIL - HPV;  benign    Current Outpatient Prescriptions  Medication Sig Dispense Refill  . clonazePAM (KLONOPIN) 0.5 MG tablet daily.  0  . estradiol (VIVELLE-DOT) 0.05 MG/24HR patch apply 1 patch topically two times a week 8 patch 12  . imipramine (TOFRANIL) 25 MG tablet daily.  0  . progesterone (PROMETRIUM) 100 MG capsule take 1 capsule by mouth once daily 90 capsule 4    No current facility-administered medications for this visit.    Family History  Problem Relation Age of Onset  . Endometrial cancer Sister 55    ROS:  Pertinent items are noted in HPI.  Otherwise, a comprehensive ROS was negative.  Exam:   BP 102/62 mmHg  Pulse 68  Resp 16  Ht 5' 1.75" (1.568 m)  Wt 120 lb (54.432 kg)  BMI 22.14 kg/m2  LMP 10/13/2011 Height: 5' 1.75" (156.8 cm) Ht Readings from Last 3 Encounters:  12/17/15 5' 1.75" (1.568 m)  11/20/14 5\' 2"  (1.575 m)  10/23/14 5' 1.75" (1.568 m)    General appearance: alert, cooperative and appears stated age Head: Normocephalic, without obvious abnormality, atraumatic Neck: no adenopathy, supple, symmetrical, trachea midline and thyroid normal to inspection and palpation Lungs: clear to auscultation bilaterally Breasts: normal appearance, no masses or tenderness, No nipple retraction or dimpling, No nipple discharge or bleeding, No axillary or supraclavicular adenopathy Heart: regular rate and rhythm Abdomen: soft, non-tender; no masses,  no organomegaly Extremities: extremities normal, atraumatic, no cyanosis or edema Skin: Skin color, texture, turgor normal. No rashes or lesions Lymph nodes: Cervical, supraclavicular, and axillary nodes normal. No abnormal inguinal nodes palpated Neurologic: Grossly normal   Pelvic: External genitalia:  no lesions  Urethra:  normal appearing urethra with no masses, tenderness or lesions              Bartholin's and Skene's: normal                 Vagina: normal appearing vagina with normal color and discharge, no lesions              Cervix: no cervical motion tenderness, no lesions and normal              Pap taken: Yes.   Bimanual Exam:  Uterus:  enlarged, 12 weeks size and ? adnexal mass on right              Adnexa: normal adnexa, no mass, fullness, tenderness and on left, ? right adnexal mass, vs uterus enlargement               Rectovaginal: Confirms                Anus:  normal sphincter tone, no lesions  Chaperone present: yes  A:  Well Woman with normal exam  Menopausal on HRT with 2 bleeding episodes  Enlarged uterus vs right adnexal mass  Medication management with PCP  P:   Reviewed health and wellness pertinent to exam  Discussed need for evaluation of PMB even though on HRT, which can occur.  Also discussed finding of enlarged uterus and possible fibroids, adenomyosis, or mass. Recommend PUS. Patient agreeable. Discussed also can uterine lining thickness to see if issues prior to scheduling Endo biopsy.   Labs:Vit.D, Lipid panel,CMP   Pap smear as above with HPVHR   counseled on breast self exam, mammography screening, osteoporosis, adequate intake of calcium and vitamin D, diet and exercise  return annually or prn  An After Visit Summary was printed and given to the patient.

## 2015-12-17 NOTE — Patient Instructions (Signed)

## 2015-12-18 ENCOUNTER — Telehealth: Payer: Self-pay | Admitting: Obstetrics & Gynecology

## 2015-12-18 LAB — VITAMIN D 25 HYDROXY (VIT D DEFICIENCY, FRACTURES): VIT D 25 HYDROXY: 19 ng/mL — AB (ref 30–100)

## 2015-12-18 NOTE — Telephone Encounter (Signed)
Spoke with pt regarding benefit for ultrasound. Patient understood and agreeable. Patient ready to schedule. Patient scheduled 12/24/15 with Dr Sabra Heck. Pt aware of arrival date and time. Pt aware of 72 hours cancellation policy with [id number] fee. No further questions. Ok to close

## 2015-12-18 NOTE — Progress Notes (Signed)
Encounter reviewed Brenda Brow, MD   

## 2015-12-21 LAB — IPS PAP TEST WITH REFLEX TO HPV

## 2015-12-22 ENCOUNTER — Other Ambulatory Visit: Payer: Self-pay

## 2015-12-22 DIAGNOSIS — E559 Vitamin D deficiency, unspecified: Secondary | ICD-10-CM

## 2015-12-22 MED ORDER — VITAMIN D (ERGOCALCIFEROL) 1.25 MG (50000 UNIT) PO CAPS: 50000.0000 [IU] | ORAL_CAPSULE | ORAL | Status: DC

## 2015-12-23 ENCOUNTER — Telehealth: Payer: Self-pay | Admitting: Certified Nurse Midwife

## 2015-12-23 DIAGNOSIS — N852 Hypertrophy of uterus: Secondary | ICD-10-CM

## 2015-12-23 NOTE — Telephone Encounter (Signed)
Patient calling with questions regarding her ultrasound appointment on Thursday.

## 2015-12-23 NOTE — Addendum Note (Signed)
Addended by: Michele Mcalpine on: 12/23/2015 04:49 PM   Modules accepted: Orders

## 2015-12-23 NOTE — Telephone Encounter (Signed)
Return call to patient. States her episode of vaginal bleeding was very light and has now stopped. Her pap smear was normal so she is questioning the need of pelvic ultrasound. Saw Brenda Ali for annual 12-17-15. Advised pap smear only assessing cervical cells. Explained any PMB warrants evaluation, no matter how light, this is done by pelvic ultrasound to evaluate endometrial lining, uterus, ovaries, pelvic structures above cervix.  Highly recommend she proceed with evaluation of PMB to rule out abnormal cells as cause. Advised information is not to be scary, just to stress importance of evaluation. Patient agreeable to proceed as scheduled tomorrow with Dr Sabra Heck.  Routing to provider for final review. Patient agreeable to disposition. Will close encounter.    CC: Dr Sabra Heck

## 2015-12-24 ENCOUNTER — Ambulatory Visit (INDEPENDENT_AMBULATORY_CARE_PROVIDER_SITE_OTHER): Payer: BLUE CROSS/BLUE SHIELD

## 2015-12-24 ENCOUNTER — Ambulatory Visit (INDEPENDENT_AMBULATORY_CARE_PROVIDER_SITE_OTHER): Payer: BLUE CROSS/BLUE SHIELD | Admitting: Obstetrics & Gynecology

## 2015-12-24 VITALS — BP 120/80 | HR 84 | Resp 16 | Ht 61.75 in | Wt 120.0 lb

## 2015-12-24 DIAGNOSIS — D252 Subserosal leiomyoma of uterus: Secondary | ICD-10-CM | POA: Diagnosis not present

## 2015-12-24 DIAGNOSIS — Z79890 Hormone replacement therapy (postmenopausal): Secondary | ICD-10-CM | POA: Diagnosis not present

## 2015-12-24 DIAGNOSIS — N852 Hypertrophy of uterus: Secondary | ICD-10-CM | POA: Diagnosis not present

## 2015-12-24 NOTE — Progress Notes (Signed)
60 y.o. G55P1011 Married Caucasian female here for pelvic ultrasound due to two episode of PMP bleeding in the past year, on HRT.  Pt states up front that she does not want to have to proceed with an endometrial biopsy.  She is in the process of planning for two weddings and is very busy.  Denies pelvic pain.  Reports two episodes were very light and dark in appearance and that has no cramping.  Patient's last menstrual period was 10/13/2011.  Contraception: PMP status Hormonal therapy:  Yes, on Vivelle 0.0.5mg  patches twice weekly and Prometrium 100gm daily Last Pap: 2/16, negative  Findings:  UTERUS: 9.2 x 4.8 x 4.1cm with left anterior subserosal fibroid 17 x 72mm EMS: 3.45mm, calcifications present ADNEXA: Left ovary: 1.6 x 1.0 x 1.1cm.  Imaging of this ovary was difficult due to overlying bowel/gas pattern       Right ovary: 2.2 x 1.5 x 1.4cm CUL DE SAC: no free fluid  Discussion:  Images reviewed with pt.  Endometrium <61mm so likelihood of abnormal pathology is very small.  However, do feel if bleeding continues, endometrial biopsy is warranted.  Pt voices clear understanding and states she will return if this occurs again.  Pt is thin and a regular exerciser and is a non-smoker as well.   Assessment:  Two prior episodes of PMP bleeding, 1.7cm uterine fibroid, endometrium <61mm  Plan:  Pt KNOWS to call with any additional bleeding to proceed with endometrial biopsy.  ~15 minutes spent with patient >50% of time was in face to face discussion of above.

## 2015-12-29 ENCOUNTER — Other Ambulatory Visit: Payer: Self-pay

## 2015-12-29 DIAGNOSIS — Z1231 Encounter for screening mammogram for malignant neoplasm of breast: Secondary | ICD-10-CM

## 2016-01-01 ENCOUNTER — Encounter: Payer: Self-pay | Admitting: Obstetrics & Gynecology

## 2016-01-01 DIAGNOSIS — Z79890 Hormone replacement therapy (postmenopausal): Secondary | ICD-10-CM | POA: Insufficient documentation

## 2016-01-01 DIAGNOSIS — D252 Subserosal leiomyoma of uterus: Secondary | ICD-10-CM | POA: Insufficient documentation

## 2016-01-01 HISTORY — DX: Subserosal leiomyoma of uterus: D25.2

## 2016-01-15 ENCOUNTER — Ambulatory Visit
Admission: RE | Admit: 2016-01-15 | Discharge: 2016-01-15 | Disposition: A | Discharge status: Home / Self Care | Payer: BLUE CROSS/BLUE SHIELD | Source: Ambulatory Visit

## 2016-01-15 DIAGNOSIS — Z1231 Encounter for screening mammogram for malignant neoplasm of breast: Secondary | ICD-10-CM

## 2016-01-19 ENCOUNTER — Other Ambulatory Visit: Payer: Self-pay | Admitting: Certified Nurse Midwife

## 2016-01-19 DIAGNOSIS — R928 Other abnormal and inconclusive findings on diagnostic imaging of breast: Secondary | ICD-10-CM

## 2016-01-29 ENCOUNTER — Ambulatory Visit
Admission: RE | Admit: 2016-01-29 | Discharge: 2016-01-29 | Disposition: A | Discharge status: Home / Self Care | Payer: BLUE CROSS/BLUE SHIELD | Source: Ambulatory Visit | Attending: Certified Nurse Midwife | Admitting: Certified Nurse Midwife

## 2016-01-29 DIAGNOSIS — R928 Other abnormal and inconclusive findings on diagnostic imaging of breast: Secondary | ICD-10-CM

## 2016-03-24 ENCOUNTER — Other Ambulatory Visit (INDEPENDENT_AMBULATORY_CARE_PROVIDER_SITE_OTHER): Payer: BLUE CROSS/BLUE SHIELD

## 2016-03-24 DIAGNOSIS — E559 Vitamin D deficiency, unspecified: Secondary | ICD-10-CM

## 2016-03-25 LAB — VITAMIN D 25 HYDROXY (VIT D DEFICIENCY, FRACTURES): Vit D, 25-Hydroxy: 24 ng/mL — ABNORMAL LOW (ref 30–100)

## 2016-12-02 ENCOUNTER — Other Ambulatory Visit: Payer: Self-pay | Admitting: Certified Nurse Midwife

## 2016-12-02 NOTE — Telephone Encounter (Signed)
Medication refill request: Estradiol patch  Last AEX:  12/17/15 DL Next AEX: 12/29/16 DL Last MMG (if hormonal medication request): 01/15/16 BIRADS0, Denisty C, TBC; 01/29/16 Dx & U/S L Breast BIRADS2, Benign L breast cyst, TBC Refill authorized: 12/07/15 #8 Patch 12R. Please advise. Thank you.

## 2016-12-29 ENCOUNTER — Encounter: Payer: Self-pay | Admitting: Certified Nurse Midwife

## 2016-12-29 ENCOUNTER — Ambulatory Visit (INDEPENDENT_AMBULATORY_CARE_PROVIDER_SITE_OTHER): Payer: BLUE CROSS/BLUE SHIELD | Admitting: Certified Nurse Midwife

## 2016-12-29 VITALS — BP 110/70 | HR 68 | Resp 16 | Ht 61.5 in | Wt 115.0 lb

## 2016-12-29 DIAGNOSIS — Z0000 Encounter for general adult medical examination without abnormal findings: Secondary | ICD-10-CM

## 2016-12-29 DIAGNOSIS — N951 Menopausal and female climacteric states: Secondary | ICD-10-CM

## 2016-12-29 DIAGNOSIS — Z01419 Encounter for gynecological examination (general) (routine) without abnormal findings: Secondary | ICD-10-CM | POA: Diagnosis not present

## 2016-12-29 LAB — TSH: TSH: 3.12 mIU/L

## 2016-12-29 LAB — LIPID PANEL
CHOLESTEROL: 220 mg/dL — AB (ref <200)
HDL: 93 mg/dL (ref >50)
LDL Cholesterol: 110 mg/dL — ABNORMAL HIGH (ref <100)
TRIGLYCERIDES: 85 mg/dL (ref <150)
Total CHOL/HDL Ratio: 2.4 Ratio (ref <5.0)
VLDL: 17 mg/dL (ref <30)

## 2016-12-29 MED ORDER — PROGESTERONE MICRONIZED 100 MG PO CAPS: 100.0000 mg | ORAL_CAPSULE | Freq: Every day | ORAL | 1 refills | Status: DC

## 2016-12-29 MED ORDER — ESTRADIOL 0.025 MG/24HR TD PTTW: 1.0000 | MEDICATED_PATCH | TRANSDERMAL | 3 refills | Status: DC

## 2016-12-29 NOTE — Progress Notes (Signed)
61 y.o. G14P1011 Married  Caucasian Fe here for annual exam.  Menopausal on HRT, working well.. Denies vaginal bleeding or vaginal dryness. Screening labs today. Sees Dr. Cheron Schaumann for medication management of Klonopin, Tofranil, all stable. Has colonoscopy scheduled with Dr. Collene Mares, TSH was elevated and needs recheck. No other health issues today. Having first grandchild this spring!  Patient's last menstrual period was 10/13/2011.          Sexually active: Yes.    The current method of family planning is post menopausal status.    Exercising: Yes.    tennis, gym Smoker:  no  Health Maintenance: Pap:  11-20-14 neg previous LGSIL 2013, 12-17-15 neg MMG:  4/17 bilateral & left breast u/s category c birads 2:neg Colonoscopy:  Scheduled for 5/18 with Dr. Collene Mares BMD:   none TDaP:  2018 Shingles: no Pneumonia: no Hep C and HIV: HIV negative Labs: desired, needs TSH recheck due to elevation at GI office. Self breast exam: done monthly   reports that she has never smoked. She has never used smokeless tobacco. She reports that she drinks about 1.2 - 1.8 oz of alcohol per week . She reports that she does not use drugs.  Past Medical History:  Diagnosis Date  . Endometriosis    Adherent R Ovary  . Infertility, female   . Interstitial cystitis   . LGSIL Pap smear of vagina 10/26/11   HPV Negative; Colpo Negative  . Panic disorder     Past Surgical History:  Procedure Laterality Date  . CERVICAL FUSION  06/2011  . COLPOSCOPY  11/16/11   Due to LGSIL - HPV;  benign  . DIAGNOSTIC LAPAROSCOPY      Current Outpatient Prescriptions  Medication Sig Dispense Refill  . clonazePAM (KLONOPIN) 0.5 MG tablet daily.  0  . estradiol (VIVELLE-DOT) 0.05 MG/24HR patch apply 1 patch ONTO THE SKIN two times a week 8 patch 0  . imipramine (TOFRANIL) 25 MG tablet daily.  0  . progesterone (PROMETRIUM) 100 MG capsule take 1 capsule by mouth once daily 90 capsule 4   No current facility-administered medications for  this visit.     Family History  Problem Relation Age of Onset  . Endometrial cancer Sister 12  . Other Brother     early onset of alzheimers    ROS:  Pertinent items are noted in HPI.  Otherwise, a comprehensive ROS was negative.  Exam:   BP 110/70   Pulse 68   Resp 16   Ht 5' 1.5" (1.562 m)   Wt 115 lb (52.2 kg)   LMP 10/13/2011   BMI 21.38 kg/m  Height: 5' 1.5" (156.2 cm) Ht Readings from Last 3 Encounters:  12/29/16 5' 1.5" (1.562 m)  12/24/15 5' 1.75" (1.568 m)  12/17/15 5' 1.75" (1.568 m)    General appearance: alert, cooperative and appears stated agenormal to inspection and palpation Head: Normocephalic, without obvious abnormality, atraumatic Neck: no adenopathy, supple, symmetrical, trachea midline and thyroid  Lungs: clear to auscultation bilaterally Breasts: normal appearance, no masses or tenderness, No nipple retraction or dimpling, No nipple discharge or bleeding, No axillary or supraclavicular adenopathy Heart: regular rate and rhythm Abdomen: soft, non-tender; no masses,  no organomegaly Extremities: extremities normal, atraumatic, no cyanosis or edema Skin: Skin color, texture, turgor normal. No rashes or lesions Lymph nodes: Cervical, supraclavicular, and axillary nodes normal. No abnormal inguinal nodes palpated Neurologic: Grossly normal   Pelvic: External genitalia:  no lesions  Urethra:  normal appearing urethra with no masses, tenderness or lesions              Bartholin's and Skene's: normal                 Vagina: normal appearing vagina with normal color and discharge, no lesions              Cervix: multiparous appearance, no cervical motion tenderness and no lesions              Pap taken: No. Bimanual Exam:  Uterus:  mid position with upper limits of normal size,non tender, history of fiboid              Adnexa: normal adnexa and no mass, fullness, tenderness               Rectovaginal: Confirms               Anus:  normal  sphincter tone, no lesions  Chaperone present: yes  A:  Well Woman with normal exam  Slight uterine enlargement, history of fibroid  Menopausal on HRT, planning on weaning off in next 6 months  Colonoscopy due, has scheduled with Dr. Collene Mares  BMD due will schedule with Mammogram  MD management for panic disorder  Family history of endometrial cancer sister  Screening labs   P:   Reviewed health and wellness pertinent to exam  Aware if vaginal bleeding she needs to advise  Discussed weaning off HRT and patient would like to proceed, will reduce dosage of Vivelle dot and patient to call in at 3 months with status, if doing well will taper off. Patient agreeable  Rx Vivelle dot see order with instructions  Continue follow up with PCP as indicated  Lab: TSH,Lipid panel, Hep C, Vitamin D  Pap smear as above not taken   counseled on breast self exam, mammography screening, adequate intake of calcium and vitamin D, diet and exercise  return annually or prn  An After Visit Summary was printed and given to the patient.

## 2016-12-29 NOTE — Patient Instructions (Signed)

## 2016-12-30 ENCOUNTER — Other Ambulatory Visit: Payer: Self-pay | Admitting: Certified Nurse Midwife

## 2016-12-30 LAB — HEPATITIS C ANTIBODY: HCV AB: NEGATIVE

## 2016-12-30 LAB — VITAMIN D 25 HYDROXY (VIT D DEFICIENCY, FRACTURES): Vit D, 25-Hydroxy: 24 ng/mL — ABNORMAL LOW (ref 30–100)

## 2016-12-30 NOTE — Progress Notes (Signed)
Encounter reviewed Jill Jertson, MD   

## 2017-01-03 ENCOUNTER — Other Ambulatory Visit: Payer: Self-pay | Admitting: Certified Nurse Midwife

## 2017-01-03 DIAGNOSIS — E559 Vitamin D deficiency, unspecified: Secondary | ICD-10-CM

## 2017-01-05 ENCOUNTER — Telehealth: Payer: Self-pay

## 2017-01-05 DIAGNOSIS — E789 Disorder of lipoprotein metabolism, unspecified: Secondary | ICD-10-CM

## 2017-01-05 DIAGNOSIS — E559 Vitamin D deficiency, unspecified: Secondary | ICD-10-CM

## 2017-01-05 NOTE — Telephone Encounter (Signed)
-----   Message from Regina Eck, CNM sent at 01/03/2017  7:42 AM EDT ----- Notify patient that Vitamin D is still low at 24, is she taking her Vitamin D?, Needs Rx And recheck in 3 months please schedule. Order placed. Hep. C is negative Lipid panel cholesterol is elevated at 220 and LDL is elevated at 110 HDL which is protective is normal.  Work on exercise and decrease cholesterol foods, fried foods. Increase dietary fiber daily with fresh vegetables and fruits. Needs recheck in 6  Months.,please schedule. No order placed until Vit. D drawn TSH is normal

## 2017-01-05 NOTE — Telephone Encounter (Signed)
Mailbox full

## 2017-01-06 NOTE — Telephone Encounter (Signed)
Letter sent.

## 2017-01-09 MED ORDER — VITAMIN D (ERGOCALCIFEROL) 1.25 MG (50000 UNIT) PO CAPS: 50000.0000 [IU] | ORAL_CAPSULE | ORAL | 0 refills | Status: DC

## 2017-01-09 NOTE — Telephone Encounter (Signed)
Spoke with patient, advised of results and recommendations as seen below per Melvia Heaps, CNM. Patient states she has not been taking Vitamin D daily.  Patient scheduled for 3 month Vit D lab on 7/13 at 9:15am and 6 month fasting lipid panel on 9/14 at 8:30am. Advised patient would confirm Vit D dosage and return call with recommendations, patient verbalizes understanding and is agreeable.    Kem Boroughs, NP- can you clarify Vitamin D 50,000 IU q7 days for Vitamin D of 24?  Cc: Melvia Heaps, CNM

## 2017-01-09 NOTE — Telephone Encounter (Signed)
Spoke with patient, advised as seen below per Kem Boroughs, NP. Patient states she did discuss bone density at AEX with Melvia Heaps, CNM, and discussed scheduling at next [facility]. Patient declined to schedule at this time. Patient verbalizes understanding and is agreeable.  Order placed for BMD and Vitamin D.  Routing to provider for final review. Patient is agreeable to disposition. Will close encounter.

## 2017-01-09 NOTE — Telephone Encounter (Signed)
This pt Vit D was @ 24. Our goal for her is ~ 50.   Given her age and history of degenerative cervical disk disease, it is recommended that she be on RX. Vit D 50,000 IU weekly and recheck in 3 months.  It is also recommended that we now need to do a BMD since she does have a Vit D deficiency to make sure she does not have Osteopenia.  Please get that scheduled for her as well.

## 2017-01-09 NOTE — Telephone Encounter (Signed)
Unable to leave message,mailbox full. 

## 2017-01-09 NOTE — Telephone Encounter (Signed)
Brenda Ali for your review.

## 2017-01-13 ENCOUNTER — Other Ambulatory Visit: Payer: Self-pay | Admitting: Certified Nurse Midwife

## 2017-01-13 DIAGNOSIS — Z1231 Encounter for screening mammogram for malignant neoplasm of breast: Secondary | ICD-10-CM

## 2017-02-23 ENCOUNTER — Ambulatory Visit
Admission: RE | Admit: 2017-02-23 | Discharge: 2017-02-23 | Disposition: A | Discharge status: Home / Self Care | Payer: BLUE CROSS/BLUE SHIELD | Source: Ambulatory Visit | Attending: Certified Nurse Midwife | Admitting: Certified Nurse Midwife

## 2017-02-23 ENCOUNTER — Ambulatory Visit
Admission: RE | Admit: 2017-02-23 | Discharge: 2017-02-23 | Disposition: A | Discharge status: Home / Self Care | Payer: BLUE CROSS/BLUE SHIELD | Source: Ambulatory Visit | Attending: Nurse Practitioner | Admitting: Nurse Practitioner

## 2017-02-23 DIAGNOSIS — E559 Vitamin D deficiency, unspecified: Secondary | ICD-10-CM

## 2017-02-23 DIAGNOSIS — Z1231 Encounter for screening mammogram for malignant neoplasm of breast: Secondary | ICD-10-CM

## 2017-03-01 ENCOUNTER — Telehealth: Payer: Self-pay | Admitting: Certified Nurse Midwife

## 2017-03-01 NOTE — Telephone Encounter (Signed)
Routing to [employer] CNM for review of BMD results from 02/23/2017. Results in EPIC.

## 2017-03-01 NOTE — Telephone Encounter (Signed)
Patient calling to speak with nurse about bone density results and to let Mrs. Debbi know she stopped taking the estrogen patch on Sunday.

## 2017-03-02 NOTE — Telephone Encounter (Signed)
This was in Patty's box

## 2017-03-02 NOTE — Telephone Encounter (Signed)
Spoke with patient. Advised of message as seen below from [place]. Patient verbalizes understanding.  Notes recorded by Regina Eck, CNM on 03/02/2017 at 10:56 AM EDT Notify patient that her bone density showed normal spine Right Hip only with osteopenia (low bone mass)  She needs to walk for weight bearing exercise at least 3-4 times weekly, make sure adequate calcium in diet 4 servings daily. Calcium with Vitamin D added 1200 mg daily with at least 800 IU of Vitamin D Avoid large amount of carbonated beverages which can affect absorption Repeat in 2 years  Routing to provider for final review. Patient agreeable to disposition. Will close encounter.

## 2017-03-10 ENCOUNTER — Telehealth: Payer: Self-pay | Admitting: Certified Nurse Midwife

## 2017-03-10 NOTE — Telephone Encounter (Signed)
Spoke with patient. Patient states that when she was seen with Brenda Ali CNM on 12/29/2016 her Vivelle dot dose was reduced to 0.025 mg twice weekly. Reports she recently decided she wanted to stop taking HRT. Stopped Vivelle dot and Progesterone for 1 week. Reports she had increased hot flashes, anxiety, and was very emotional. "I have a very stressful job and it was just not the right time." Patient restarted Vivelle Dot and Progesterone and reports feeling better after 4 days. Patient does not feel she can stop HRT at this time. Wanted to let Brenda Ali CNM know and report she will try again when she feels she can possible handle it.  Routing to provider for final review. Okay to close encounter?

## 2017-03-10 NOTE — Telephone Encounter (Signed)
Yes ok to close

## 2017-03-10 NOTE — Telephone Encounter (Signed)
Patient would like to give information to a nurse regarding starting her patches again.

## 2017-04-06 ENCOUNTER — Telehealth: Payer: Self-pay | Admitting: Certified Nurse Midwife

## 2017-04-06 NOTE — Telephone Encounter (Signed)
Spoke with patient. Advised patient to ensure she is able to speak with Melvia Heaps CNM about her medications on 04/14/2017 she will need an OV and labs can be performed before she leaves. Patient is agreeable. Appointment switched to 04/14/2017 at 10 am with Melvia Heaps CNM.  Routing to provider for final review. Patient agreeable to disposition. Will close encounter.

## 2017-04-06 NOTE — Telephone Encounter (Signed)
Patient has an appointment for labwork on Friday 7/13. She would like to speak with Mrs. Debbi at that appointment briefly about her medication.

## 2017-04-14 ENCOUNTER — Ambulatory Visit (INDEPENDENT_AMBULATORY_CARE_PROVIDER_SITE_OTHER): Payer: BLUE CROSS/BLUE SHIELD | Admitting: Certified Nurse Midwife

## 2017-04-14 ENCOUNTER — Other Ambulatory Visit: Payer: Self-pay

## 2017-04-14 ENCOUNTER — Encounter: Payer: Self-pay | Admitting: Certified Nurse Midwife

## 2017-04-14 VITALS — BP 92/60 | HR 60 | Resp 16 | Ht 61.5 in | Wt 119.0 lb

## 2017-04-14 DIAGNOSIS — E789 Disorder of lipoprotein metabolism, unspecified: Secondary | ICD-10-CM | POA: Diagnosis not present

## 2017-04-14 DIAGNOSIS — N951 Menopausal and female climacteric states: Secondary | ICD-10-CM | POA: Diagnosis not present

## 2017-04-14 DIAGNOSIS — Z79890 Hormone replacement therapy (postmenopausal): Secondary | ICD-10-CM | POA: Diagnosis not present

## 2017-04-14 DIAGNOSIS — E559 Vitamin D deficiency, unspecified: Secondary | ICD-10-CM | POA: Diagnosis not present

## 2017-04-14 MED ORDER — PROGESTERONE MICRONIZED 100 MG PO CAPS: 100.0000 mg | ORAL_CAPSULE | Freq: Every day | ORAL | 1 refills | Status: DC

## 2017-04-14 NOTE — Addendum Note (Signed)
Addended by: Susy Manor on: 04/14/2017 09:15 AM   Modules accepted: Orders

## 2017-04-14 NOTE — Progress Notes (Signed)
61 y.o.Married Caucasian female G2P1011here for follow-up of menopausal symptoms and HRT use. Patient was using HRT in form of Vivelle Dot .025mg /Prometrium 100 mg. She was given reduced dosage at aex in 12/29/16. Working well and decided to wean off of HRT, due to conversation of concern for long term use risks. Patient did well for a short time and then became symptomatic with anxiety feeling with hot flashes. She restarted HRT and has been feeling so much better. Would like to continue and try to wean off after the first of the year. Also here to discuss Vitamin D deficiency and management. Patient last vitamin D was 24 and is only taking Vitamin D in her calcium supplement which is 800 IU. Due for recheck today after using Rx 5000 Vitamin D once weekly x 12 weeks. Has slight osteopenia in right hip and knows this is important. No other concerns today.   O:Healthy WD,WN female, appropriately dressed    Weight:119, slim build Affect : Appropriate    A:Menopausal on HRT difficulty in weaning off, desires continuance at this point. Family history of endometrial cancer( sister 41) Osteopenia  Vitamin D deficiency recheck today Elevated cholesterol 220, LDL 110 but overall panel good, HDL 93, CHOL/HDL ratio 2.4  P:Discussed risks/benefits of HRT continuance and concerns with uterine/breast cancer, cardiovascular changes and [facility] information. Discussed benefits for her with osteopenia and still having some symptoms(flushing). Patient and provider agree she is good candidate to continue low dose HRT at this time and work with cessation in 2019. Will update RX. Labs: Vitamin D, Lipid panel Discussed vitamin D and calcium importance with prevention of osteoporosis and role of weight bearing exercise daily. Questions addressed at length  RV prn/aex  29 minutes of time in consultation regarding HRT use and vitamin d deficiency.

## 2017-04-15 LAB — VITAMIN D 25 HYDROXY (VIT D DEFICIENCY, FRACTURES): VIT D 25 HYDROXY: 36.3 ng/mL (ref 30.0–100.0)

## 2017-04-23 ENCOUNTER — Other Ambulatory Visit: Payer: Self-pay | Admitting: Certified Nurse Midwife

## 2017-04-23 DIAGNOSIS — N951 Menopausal and female climacteric states: Secondary | ICD-10-CM

## 2017-04-24 NOTE — Telephone Encounter (Signed)
Medication refill request: Estradiol 0.025 mg/24hr Last AEX:  12/29/16 DL Next AEX: 01/04/18 Last MMG (if hormonal medication request): 02/23/17 BIRADS 1 negative/density c Refill authorized: 12/29/16 #8patch w/3 refills; today please advise; DL out of office today

## 2017-04-27 ENCOUNTER — Other Ambulatory Visit: Payer: Self-pay | Admitting: Certified Nurse Midwife

## 2017-04-27 DIAGNOSIS — N951 Menopausal and female climacteric states: Secondary | ICD-10-CM

## 2017-04-27 NOTE — Telephone Encounter (Signed)
Medication refill request: estradiol  Last AEX:  12/29/16 DL Next AEX: 01/04/18 DL Last MMG (if hormonal medication request): 02/24/17 BIRADS1:neg  Refill authorized: 04/24/17 #8patch/8R to [facility]

## 2017-06-14 ENCOUNTER — Other Ambulatory Visit: Payer: Self-pay

## 2017-06-15 ENCOUNTER — Other Ambulatory Visit: Payer: Self-pay

## 2017-06-16 ENCOUNTER — Other Ambulatory Visit: Payer: Self-pay

## 2017-06-29 ENCOUNTER — Other Ambulatory Visit (INDEPENDENT_AMBULATORY_CARE_PROVIDER_SITE_OTHER): Payer: BLUE CROSS/BLUE SHIELD

## 2017-06-29 DIAGNOSIS — E789 Disorder of lipoprotein metabolism, unspecified: Secondary | ICD-10-CM | POA: Diagnosis not present

## 2017-06-30 DIAGNOSIS — Z23 Encounter for immunization: Secondary | ICD-10-CM | POA: Diagnosis not present

## 2017-06-30 DIAGNOSIS — M542 Cervicalgia: Secondary | ICD-10-CM | POA: Diagnosis not present

## 2017-06-30 DIAGNOSIS — F419 Anxiety disorder, unspecified: Secondary | ICD-10-CM | POA: Diagnosis not present

## 2017-06-30 LAB — LIPID PANEL
Chol/HDL Ratio: 2.4 ratio (ref 0.0–4.4)
Cholesterol, Total: 203 mg/dL — ABNORMAL HIGH (ref 100–199)
HDL: 84 mg/dL (ref >39)
LDL Calculated: 98 mg/dL (ref 0–99)
TRIGLYCERIDES: 104 mg/dL (ref 0–149)
VLDL Cholesterol Cal: 21 mg/dL (ref 5–40)

## 2017-07-31 DIAGNOSIS — M542 Cervicalgia: Secondary | ICD-10-CM | POA: Diagnosis not present

## 2017-08-03 DIAGNOSIS — M542 Cervicalgia: Secondary | ICD-10-CM | POA: Diagnosis not present

## 2017-08-11 DIAGNOSIS — Z09 Encounter for follow-up examination after completed treatment for conditions other than malignant neoplasm: Secondary | ICD-10-CM | POA: Diagnosis not present

## 2017-08-11 DIAGNOSIS — M542 Cervicalgia: Secondary | ICD-10-CM | POA: Diagnosis not present

## 2017-08-21 DIAGNOSIS — M542 Cervicalgia: Secondary | ICD-10-CM | POA: Diagnosis not present

## 2017-10-06 DIAGNOSIS — Z1211 Encounter for screening for malignant neoplasm of colon: Secondary | ICD-10-CM | POA: Diagnosis not present

## 2017-10-27 DIAGNOSIS — H31001 Unspecified chorioretinal scars, right eye: Secondary | ICD-10-CM | POA: Diagnosis not present

## 2017-10-27 DIAGNOSIS — H5213 Myopia, bilateral: Secondary | ICD-10-CM | POA: Diagnosis not present

## 2017-10-27 DIAGNOSIS — H04123 Dry eye syndrome of bilateral lacrimal glands: Secondary | ICD-10-CM | POA: Diagnosis not present

## 2017-10-27 DIAGNOSIS — H524 Presbyopia: Secondary | ICD-10-CM | POA: Diagnosis not present

## 2017-12-03 ENCOUNTER — Other Ambulatory Visit: Payer: Self-pay | Admitting: Certified Nurse Midwife

## 2017-12-03 DIAGNOSIS — N951 Menopausal and female climacteric states: Secondary | ICD-10-CM

## 2017-12-04 NOTE — Telephone Encounter (Signed)
Medication refill request: Progesterone 100mg  #90 Last AEX:  12-29-16 Next AEX: 01-04-18 Last MMG (if hormonal medication request): 02-23-17 Neg/BiRads1 Refill authorized: Please advise

## 2017-12-29 DIAGNOSIS — N3010 Interstitial cystitis (chronic) without hematuria: Secondary | ICD-10-CM | POA: Diagnosis not present

## 2017-12-29 DIAGNOSIS — F419 Anxiety disorder, unspecified: Secondary | ICD-10-CM | POA: Diagnosis not present

## 2018-01-04 ENCOUNTER — Encounter: Payer: Self-pay | Admitting: Certified Nurse Midwife

## 2018-01-04 ENCOUNTER — Other Ambulatory Visit: Payer: Self-pay | Admitting: Obstetrics and Gynecology

## 2018-01-04 ENCOUNTER — Ambulatory Visit: Payer: BLUE CROSS/BLUE SHIELD | Admitting: Certified Nurse Midwife

## 2018-01-04 ENCOUNTER — Other Ambulatory Visit: Payer: Self-pay

## 2018-01-04 ENCOUNTER — Other Ambulatory Visit (HOSPITAL_COMMUNITY)
Admission: RE | Admit: 2018-01-04 | Discharge: 2018-01-04 | Disposition: A | Discharge status: Home / Self Care | Payer: BLUE CROSS/BLUE SHIELD | Source: Ambulatory Visit | Attending: Certified Nurse Midwife | Admitting: Certified Nurse Midwife

## 2018-01-04 VITALS — BP 102/60 | HR 60 | Resp 16 | Ht 61.5 in | Wt 120.0 lb

## 2018-01-04 DIAGNOSIS — Z0000 Encounter for general adult medical examination without abnormal findings: Secondary | ICD-10-CM | POA: Diagnosis not present

## 2018-01-04 DIAGNOSIS — N951 Menopausal and female climacteric states: Secondary | ICD-10-CM

## 2018-01-04 DIAGNOSIS — Z124 Encounter for screening for malignant neoplasm of cervix: Secondary | ICD-10-CM | POA: Diagnosis not present

## 2018-01-04 DIAGNOSIS — E559 Vitamin D deficiency, unspecified: Secondary | ICD-10-CM

## 2018-01-04 DIAGNOSIS — Z01419 Encounter for gynecological examination (general) (routine) without abnormal findings: Secondary | ICD-10-CM

## 2018-01-04 LAB — POCT URINALYSIS DIPSTICK
BILIRUBIN UA: NEGATIVE
Glucose, UA: NEGATIVE
KETONES UA: NEGATIVE
Leukocytes, UA: NEGATIVE
Nitrite, UA: NEGATIVE
PH UA: 5 (ref 5.0–8.0)
Protein, UA: NEGATIVE
RBC UA: NEGATIVE
UROBILINOGEN UA: NEGATIVE U/dL — AB

## 2018-01-04 MED ORDER — ESTRADIOL 0.025 MG/24HR TD PTTW: 1.0000 | MEDICATED_PATCH | TRANSDERMAL | 12 refills | Status: DC

## 2018-01-04 MED ORDER — PROGESTERONE MICRONIZED 100 MG PO CAPS: 100.0000 mg | ORAL_CAPSULE | Freq: Every day | ORAL | 4 refills | Status: DC

## 2018-01-04 NOTE — Patient Instructions (Signed)

## 2018-01-04 NOTE — Progress Notes (Signed)
62 y.o. G7P1011 Married  Caucasian Fe here for annual exam. Menopausal continues on HRT. Desires continuance. Denies any health issues other the last year. Has a new grandchild! Discussing retirement in the next year to enjoy family. Sees PCP for medication management of Klonopin.. Continues to work now and has no health concerns today. Planning a vacation soon!  Patient's last menstrual period was 10/13/2011.          Sexually active: Yes.    The current method of family planning is post menopausal status.    Exercising: Yes.    tennis, walking Smoker:  no  Health Maintenance: Pap:  12-17-15 neg History of Abnormal Pap: yes MMG:  02-23-17 category c density birads 1:neg Self Breast exams: occ Colonoscopy:  1/19 negative BMD:   2018 normal TDaP:  2018 Shingles: no Pneumonia: no Hep C and HIV: Hep c neg 2018 Labs: poct urine-neg   reports that she has never smoked. She has never used smokeless tobacco. She reports that she drinks about 1.2 - 1.8 oz of alcohol per week. She reports that she does not use drugs.  Past Medical History:  Diagnosis Date  . Endometriosis    Adherent R Ovary  . Infertility, female   . Interstitial cystitis   . LGSIL Pap smear of vagina 10/26/11   HPV Negative; Colpo Negative  . Panic disorder     Past Surgical History:  Procedure Laterality Date  . CERVICAL FUSION  06/2011  . COLPOSCOPY  11/16/11   Due to LGSIL - HPV;  benign  . DIAGNOSTIC LAPAROSCOPY      Current Outpatient Medications  Medication Sig Dispense Refill  . clonazePAM (KLONOPIN) 0.5 MG tablet daily.  0  . estradiol (VIVELLE-DOT) 0.025 MG/24HR apply 1 patch ONTO THE SKIN two times a week 8 patch 8  . imipramine (TOFRANIL) 25 MG tablet daily.  0  . progesterone (PROMETRIUM) 100 MG capsule TAKE 1 CAPSULE BY MOUTH ONCE DAILY 90 capsule 0   No current facility-administered medications for this visit.     Family History  Problem Relation Age of Onset  . Endometrial cancer Sister 24  .  Other Brother        early onset of alzheimers    ROS:  Pertinent items are noted in HPI.  Otherwise, a comprehensive ROS was negative.  Exam:   LMP 10/13/2011    Ht Readings from Last 3 Encounters:  04/14/17 5' 1.5" (1.562 m)  12/29/16 5' 1.5" (1.562 m)  12/24/15 5' 1.75" (1.568 m)    General appearance: alert, cooperative and appears stated age Head: Normocephalic, without obvious abnormality, atraumatic Neck: no adenopathy, supple, symmetrical, trachea midline and thyroid normal to inspection and palpation Lungs: clear to auscultation bilaterally Breasts: normal appearance, no masses or tenderness, No nipple retraction or dimpling, No nipple discharge or bleeding, No axillary or supraclavicular adenopathy Heart: regular rate and rhythm Abdomen: soft, non-tender; no masses,  no organomegaly Extremities: extremities normal, atraumatic, no cyanosis or edema Skin: Skin color, texture, turgor normal. No rashes or lesions Lymph nodes: Cervical, supraclavicular, and axillary nodes normal. No abnormal inguinal nodes palpated Neurologic: Grossly normal   Pelvic: External genitalia:  no lesions              Urethra:  normal appearing urethra with no masses, tenderness or lesions              Bartholin's and Skene's: normal  Vagina: normal appearing vagina with normal color and discharge, no lesions              Cervix: no cervical motion tenderness, no lesions and normal appearance              Pap taken: Yes.   Bimanual Exam:  Uterus:  normal size, contour, position, consistency, mobility, non-tender and anteverted, slight enlargement noted, but  history of fibroid              Adnexa: normal adnexa and no mass, fullness, tenderness               Rectovaginal: Confirms               Anus:  normal sphincter tone, no lesions  Chaperone present: yes  A:  Well Woman with normal exam  Menopausal on HRT and desires continuation  Mammogram due in 5/19 will  schedule  Enlarged uterus history of fibroid  Continue follow up with MD as needed.  Screening labs  P:   Reviewed health and wellness pertinent to exam  Discussed risks/benefits/warning signs of HRT and current recommendation for use for symptomatic relief of menopause. Discussed she has done well on lower dosage after trying to stop last year. Patient desires continuance and voiced understanding of information of risks and warning signs. Provider and patient agree continuing is a good choice at this time and patient will advise if any change in health status.  Rx Vivelle dot see order with instructions  Rx Prometrium see order with instructions  No change from previous exam.  Labs: CMP,Lipid panel, TSH, CBC,Vitamin D (has form to be filled out  Pap smear: no   counseled on breast self exam, mammography screening, use and side effects of HRT, adequate intake of calcium and vitamin D, diet and exercise  return annually or prn  An After Visit Summary was printed and given to the patient.

## 2018-01-04 NOTE — Addendum Note (Signed)
Addended by: Regina Eck on: 01/04/2018 02:34 PM   Modules accepted: Orders

## 2018-01-05 LAB — COMPREHENSIVE METABOLIC PANEL
A/G RATIO: 1.9 (ref 1.2–2.2)
ALK PHOS: 67 IU/L (ref 39–117)
ALT: 11 IU/L (ref 0–32)
AST: 19 IU/L (ref 0–40)
Albumin: 5 g/dL — ABNORMAL HIGH (ref 3.6–4.8)
BILIRUBIN TOTAL: 0.6 mg/dL (ref 0.0–1.2)
BUN / CREAT RATIO: 15 (ref 12–28)
BUN: 15 mg/dL (ref 8–27)
CHLORIDE: 102 mmol/L (ref 96–106)
CO2: 22 mmol/L (ref 20–29)
Calcium: 9.8 mg/dL (ref 8.7–10.3)
Creatinine, Ser: 1 mg/dL (ref 0.57–1.00)
GFR calc Af Amer: 69 mL/min/{1.73_m2} (ref >59)
GFR calc non Af Amer: 60 mL/min/{1.73_m2} (ref >59)
GLOBULIN, TOTAL: 2.6 g/dL (ref 1.5–4.5)
Glucose: 86 mg/dL (ref 65–99)
Potassium: 4.5 mmol/L (ref 3.5–5.2)
SODIUM: 140 mmol/L (ref 134–144)
Total Protein: 7.6 g/dL (ref 6.0–8.5)

## 2018-01-05 LAB — TSH: TSH: 3.61 u[IU]/mL (ref 0.450–4.500)

## 2018-01-05 LAB — CBC
Hematocrit: 46.9 % — ABNORMAL HIGH (ref 34.0–46.6)
Hemoglobin: 15.4 g/dL (ref 11.1–15.9)
MCH: 29.6 pg (ref 26.6–33.0)
MCHC: 32.8 g/dL (ref 31.5–35.7)
MCV: 90 fL (ref 79–97)
Platelets: 246 10*3/uL (ref 150–379)
RBC: 5.2 x10E6/uL (ref 3.77–5.28)
RDW: 13.4 % (ref 12.3–15.4)
WBC: 9 10*3/uL (ref 3.4–10.8)

## 2018-01-05 LAB — VITAMIN D 25 HYDROXY (VIT D DEFICIENCY, FRACTURES): VIT D 25 HYDROXY: 24.2 ng/mL — AB (ref 30.0–100.0)

## 2018-01-05 LAB — LIPID PANEL
CHOLESTEROL TOTAL: 235 mg/dL — AB (ref 100–199)
Chol/HDL Ratio: 2.7 ratio (ref 0.0–4.4)
HDL: 87 mg/dL (ref >39)
LDL Calculated: 126 mg/dL — ABNORMAL HIGH (ref 0–99)
TRIGLYCERIDES: 112 mg/dL (ref 0–149)
VLDL CHOLESTEROL CAL: 22 mg/dL (ref 5–40)

## 2018-01-08 LAB — CYTOLOGY - PAP
Diagnosis: NEGATIVE
HPV (WINDOPATH): NOT DETECTED

## 2018-02-19 ENCOUNTER — Other Ambulatory Visit: Payer: Self-pay | Admitting: Certified Nurse Midwife

## 2018-02-19 DIAGNOSIS — Z1231 Encounter for screening mammogram for malignant neoplasm of breast: Secondary | ICD-10-CM

## 2018-03-15 ENCOUNTER — Ambulatory Visit
Admission: RE | Admit: 2018-03-15 | Discharge: 2018-03-15 | Disposition: A | Discharge status: Home / Self Care | Payer: BLUE CROSS/BLUE SHIELD | Source: Ambulatory Visit | Attending: Certified Nurse Midwife | Admitting: Certified Nurse Midwife

## 2018-03-15 DIAGNOSIS — Z1231 Encounter for screening mammogram for malignant neoplasm of breast: Secondary | ICD-10-CM | POA: Diagnosis not present

## 2018-04-06 DIAGNOSIS — L239 Allergic contact dermatitis, unspecified cause: Secondary | ICD-10-CM | POA: Diagnosis not present

## 2018-07-12 DIAGNOSIS — Z23 Encounter for immunization: Secondary | ICD-10-CM | POA: Diagnosis not present

## 2018-07-12 DIAGNOSIS — F419 Anxiety disorder, unspecified: Secondary | ICD-10-CM | POA: Diagnosis not present

## 2018-10-03 DIAGNOSIS — J029 Acute pharyngitis, unspecified: Secondary | ICD-10-CM | POA: Diagnosis not present

## 2018-10-03 DIAGNOSIS — J209 Acute bronchitis, unspecified: Secondary | ICD-10-CM | POA: Diagnosis not present

## 2018-10-11 DIAGNOSIS — R590 Localized enlarged lymph nodes: Secondary | ICD-10-CM | POA: Diagnosis not present

## 2018-10-27 DIAGNOSIS — R05 Cough: Secondary | ICD-10-CM | POA: Diagnosis not present

## 2018-10-27 DIAGNOSIS — J029 Acute pharyngitis, unspecified: Secondary | ICD-10-CM | POA: Diagnosis not present

## 2018-11-02 DIAGNOSIS — H8110 Benign paroxysmal vertigo, unspecified ear: Secondary | ICD-10-CM | POA: Diagnosis not present

## 2018-11-02 DIAGNOSIS — J069 Acute upper respiratory infection, unspecified: Secondary | ICD-10-CM | POA: Diagnosis not present

## 2018-11-30 DIAGNOSIS — H52203 Unspecified astigmatism, bilateral: Secondary | ICD-10-CM | POA: Diagnosis not present

## 2018-11-30 DIAGNOSIS — H2513 Age-related nuclear cataract, bilateral: Secondary | ICD-10-CM | POA: Diagnosis not present

## 2018-11-30 DIAGNOSIS — H5213 Myopia, bilateral: Secondary | ICD-10-CM | POA: Diagnosis not present

## 2018-11-30 DIAGNOSIS — H31001 Unspecified chorioretinal scars, right eye: Secondary | ICD-10-CM | POA: Diagnosis not present

## 2019-01-01 ENCOUNTER — Other Ambulatory Visit: Payer: Self-pay | Admitting: Certified Nurse Midwife

## 2019-01-01 DIAGNOSIS — N951 Menopausal and female climacteric states: Secondary | ICD-10-CM

## 2019-01-01 NOTE — Telephone Encounter (Signed)
Medication refill request: vivelle dot  Last AEX:  01/04/18 DL Next AEX: 01/25/19 DL Last MMG (if hormonal medication request): 03/15/18  Refill authorized: 01/04/18 #8/12R. Today please advise.

## 2019-01-25 ENCOUNTER — Other Ambulatory Visit: Payer: Self-pay | Admitting: Certified Nurse Midwife

## 2019-01-25 ENCOUNTER — Ambulatory Visit: Payer: BLUE CROSS/BLUE SHIELD | Admitting: Certified Nurse Midwife

## 2019-01-25 DIAGNOSIS — N951 Menopausal and female climacteric states: Secondary | ICD-10-CM

## 2019-01-28 NOTE — Telephone Encounter (Signed)
Medication refill request: progesterone  Last AEX:  01/04/18 DL Next AEX: none Last MMG (if hormonal medication request): 03/15/18 BIRADS1:Neg.  Refill authorized: 01/04/18 #90/4R. Today please advise.

## 2019-03-20 ENCOUNTER — Other Ambulatory Visit: Payer: Self-pay | Admitting: Certified Nurse Midwife

## 2019-03-20 DIAGNOSIS — Z1231 Encounter for screening mammogram for malignant neoplasm of breast: Secondary | ICD-10-CM

## 2019-03-22 ENCOUNTER — Ambulatory Visit
Admission: RE | Admit: 2019-03-22 | Discharge: 2019-03-22 | Disposition: A | Discharge status: Home / Self Care | Payer: BC Managed Care – PPO | Source: Ambulatory Visit | Attending: Certified Nurse Midwife | Admitting: Certified Nurse Midwife

## 2019-03-22 ENCOUNTER — Other Ambulatory Visit: Payer: Self-pay

## 2019-03-22 DIAGNOSIS — Z1231 Encounter for screening mammogram for malignant neoplasm of breast: Secondary | ICD-10-CM | POA: Diagnosis not present

## 2019-03-27 ENCOUNTER — Other Ambulatory Visit: Payer: Self-pay | Admitting: *Deleted

## 2019-03-27 DIAGNOSIS — N951 Menopausal and female climacteric states: Secondary | ICD-10-CM

## 2019-03-27 MED ORDER — ESTRADIOL 0.025 MG/24HR TD PTTW: MEDICATED_PATCH | TRANSDERMAL | 0 refills | Status: DC

## 2019-03-27 NOTE — Telephone Encounter (Signed)
Medication refill request: Estradiol patch  Last AEX:  01-04-18 DL  Next AEX: 04-08-2019 Last MMG (if hormonal medication request): 03-22-2019 BIRADS 1 negative  Refill authorized: 01-01-2019 #24, 0RF. Today, please advise.   Medication pended for #8, 0RF. Please refill if appropriate.

## 2019-04-03 NOTE — Progress Notes (Addendum)
63 y.o. G49P1011 Married  Caucasian Fe here for annual exam. Post menopausal on Vivelle Dot/Prometrium working well desires continuance. Has finally decided on retirement. Would like to stop her HRT, after she is in a healthy routine with walking and exercising. Needs labs for insurance screening. Sees PCP Dr.Walters prn. No health issues today. Excited about retirement.  Patient's last menstrual period was 10/13/2011.          Sexually active: Yes.    The current method of family planning is post menopausal status.    Exercising: Yes.    walking, tennis Smoker:  no  Review of Systems  Constitutional: Negative.   HENT: Negative.   Eyes: Negative.   Respiratory: Negative.   Cardiovascular: Negative.   Gastrointestinal: Negative.   Genitourinary: Negative.   Musculoskeletal: Negative.   Skin: Negative.   Neurological: Negative.   Endo/Heme/Allergies: Negative.   Psychiatric/Behavioral: Negative.     Health Maintenance: Pap:  12-17-15 neg, 01-04-18 neg HPV HR neg History of Abnormal Pap: yes MMG:  03-22-2019 category c density birads 1:neg Self Breast exams: occ Colonoscopy:  1/19 neg BMD:   2018 normal TDaP:  2018 Shingles: no Pneumonia: no Hep C and HIV: hep c neg 2018 Labs: poct urine-neg   reports that she has never smoked. She has never used smokeless tobacco. She reports current alcohol use of about 2.0 - 3.0 standard drinks of alcohol per week. She reports that she does not use drugs.  Past Medical History:  Diagnosis Date  . Endometriosis    Adherent R Ovary  . Infertility, female   . Interstitial cystitis   . LGSIL Pap smear of vagina 10/26/11   HPV Negative; Colpo Negative  . Panic disorder     Past Surgical History:  Procedure Laterality Date  . CERVICAL FUSION  06/2011  . COLPOSCOPY  11/16/11   Due to LGSIL - HPV;  benign  . DIAGNOSTIC LAPAROSCOPY      Current Outpatient Medications  Medication Sig Dispense Refill  . clonazePAM (KLONOPIN) 0.5 MG tablet  daily.  0  . estradiol (VIVELLE-DOT) 0.025 MG/24HR One patch twice weekly 8 patch 0  . imipramine (TOFRANIL) 25 MG tablet daily.  0  . progesterone (PROMETRIUM) 100 MG capsule TAKE 1 CAPSULE(100 MG) BY MOUTH DAILY 90 capsule 0   No current facility-administered medications for this visit.     Family History  Problem Relation Age of Onset  . Endometrial cancer Sister 88  . Other Brother        early onset of alzheimers    ROS:  Pertinent items are noted in HPI.  Otherwise, a comprehensive ROS was negative.  Exam:   BP 110/70   Pulse 68   Temp (!) 97.4 F (36.3 C) (Skin)   Resp 16   Ht 5' 1.75" (1.568 m)   Wt 126 lb (57.2 kg)   LMP 10/13/2011   BMI 23.23 kg/m  Height: 5' 1.75" (156.8 cm) Ht Readings from Last 3 Encounters:  04/08/19 5' 1.75" (1.568 m)  01/04/18 5' 1.5" (1.562 m)  04/14/17 5' 1.5" (1.562 m)    General appearance: alert, cooperative and appears stated age Head: Normocephalic, without obvious abnormality, atraumatic Neck: no adenopathy, supple, symmetrical, trachea midline and thyroid normal to inspection and palpation Lungs: clear to auscultation bilaterally Breasts: normal appearance, no masses or tenderness, No nipple retraction or dimpling, No nipple discharge or bleeding, No axillary or supraclavicular adenopathy Heart: regular rate and rhythm Abdomen: soft, non-tender; no masses,  no  organomegaly Extremities: extremities normal, atraumatic, no cyanosis or edema Skin: Skin color, texture, turgor normal. No rashes or lesions Lymph nodes: Cervical, supraclavicular, and axillary nodes normal. No abnormal inguinal nodes palpated Neurologic: Grossly normal   Pelvic: External genitalia:  no lesions              Urethra:  normal appearing urethra with no masses, tenderness or lesions              Bartholin's and Skene's: normal                 Vagina: normal appearing vagina with normal color and discharge, no lesions              Cervix: no cervical  motion tenderness, no lesions and normal appearance              Pap taken: No. Bimanual Exam:  Uterus:  normal size, contour, position, consistency, mobility, non-tender and anteverted              Adnexa: normal adnexa and no mass, fullness, tenderness               Rectovaginal: Confirms               Anus:  normal sphincter tone, no lesions  Chaperone present: yes  A:  Well Woman with normal exam  Post menopausal on HRT encouraged to wean off  PCP management of Klonopin, working well  Screening labs  P:   Reviewed health and wellness pertinent to exam  Discussed risks/benefits/warning signs with HRT increase risk with use long term. She is aware and plans to wean off. Instructions given to cut patch in half and wear as usual for 2 weeks and then wean to one half weekly and wean off after 2-4 weeks.Continue Prometrium daily as directed, until stops patch. Will renew Rx and patient will advise if problems with stopping.  Rx Vivelle -Dot, Rx Prometrium see order with instructions  Continue follow up with PCP as indicated  Labs: Vitamin D, TSH, Lipid panel, CMP, CBC  Pap smear: no   counseled on breast self exam, mammography screening, feminine hygiene, menopause, osteoporosis, adequate intake of calcium and vitamin D, diet and exercise  return annually or prn  Note received from  patient weaned off HRT end of 09/2019  An After Visit Summary was printed and given to the patient.

## 2019-04-08 ENCOUNTER — Encounter: Payer: Self-pay | Admitting: Certified Nurse Midwife

## 2019-04-08 ENCOUNTER — Ambulatory Visit: Payer: BC Managed Care – PPO | Admitting: Certified Nurse Midwife

## 2019-04-08 ENCOUNTER — Other Ambulatory Visit: Payer: Self-pay

## 2019-04-08 VITALS — BP 110/70 | HR 68 | Temp 97.4°F | Resp 16 | Ht 61.75 in | Wt 126.0 lb

## 2019-04-08 DIAGNOSIS — N951 Menopausal and female climacteric states: Secondary | ICD-10-CM

## 2019-04-08 DIAGNOSIS — Z01419 Encounter for gynecological examination (general) (routine) without abnormal findings: Secondary | ICD-10-CM

## 2019-04-08 DIAGNOSIS — E559 Vitamin D deficiency, unspecified: Secondary | ICD-10-CM

## 2019-04-08 DIAGNOSIS — Z0000 Encounter for general adult medical examination without abnormal findings: Secondary | ICD-10-CM | POA: Diagnosis not present

## 2019-04-08 LAB — POCT URINALYSIS DIPSTICK
Bilirubin, UA: NEGATIVE
Blood, UA: NEGATIVE
Glucose, UA: NEGATIVE
Ketones, UA: NEGATIVE
Leukocytes, UA: NEGATIVE
Nitrite, UA: NEGATIVE
Protein, UA: NEGATIVE
Urobilinogen, UA: NEGATIVE E.U./dL — AB
pH, UA: 5 (ref 5.0–8.0)

## 2019-04-08 MED ORDER — PROGESTERONE MICRONIZED 100 MG PO CAPS: ORAL_CAPSULE | ORAL | 1 refills | Status: DC

## 2019-04-08 MED ORDER — ESTRADIOL 0.025 MG/24HR TD PTTW: MEDICATED_PATCH | TRANSDERMAL | 6 refills | Status: DC

## 2019-04-09 LAB — COMPREHENSIVE METABOLIC PANEL
ALT: 9 IU/L (ref 0–32)
AST: 24 IU/L (ref 0–40)
Albumin/Globulin Ratio: 1.8 (ref 1.2–2.2)
Albumin: 4.5 g/dL (ref 3.8–4.8)
Alkaline Phosphatase: 77 IU/L (ref 39–117)
BUN/Creatinine Ratio: 21 (ref 12–28)
BUN: 19 mg/dL (ref 8–27)
Bilirubin Total: 0.5 mg/dL (ref 0.0–1.2)
CO2: 23 mmol/L (ref 20–29)
Calcium: 9.5 mg/dL (ref 8.7–10.3)
Chloride: 99 mmol/L (ref 96–106)
Creatinine, Ser: 0.9 mg/dL (ref 0.57–1.00)
GFR calc Af Amer: 78 mL/min/{1.73_m2} (ref >59)
GFR calc non Af Amer: 68 mL/min/{1.73_m2} (ref >59)
Globulin, Total: 2.5 g/dL (ref 1.5–4.5)
Glucose: 82 mg/dL (ref 65–99)
Potassium: 4.3 mmol/L (ref 3.5–5.2)
Sodium: 137 mmol/L (ref 134–144)
Total Protein: 7 g/dL (ref 6.0–8.5)

## 2019-04-09 LAB — LIPID PANEL
Chol/HDL Ratio: 2.6 ratio (ref 0.0–4.4)
Cholesterol, Total: 214 mg/dL — ABNORMAL HIGH (ref 100–199)
HDL: 81 mg/dL (ref >39)
LDL Calculated: 111 mg/dL — ABNORMAL HIGH (ref 0–99)
Triglycerides: 109 mg/dL (ref 0–149)
VLDL Cholesterol Cal: 22 mg/dL (ref 5–40)

## 2019-04-09 LAB — CBC
Hematocrit: 42.9 % (ref 34.0–46.6)
Hemoglobin: 13.9 g/dL (ref 11.1–15.9)
MCH: 29 pg (ref 26.6–33.0)
MCHC: 32.4 g/dL (ref 31.5–35.7)
MCV: 90 fL (ref 79–97)
Platelets: 239 10*3/uL (ref 150–450)
RBC: 4.79 x10E6/uL (ref 3.77–5.28)
RDW: 12.5 % (ref 11.7–15.4)
WBC: 5.8 10*3/uL (ref 3.4–10.8)

## 2019-04-09 LAB — TSH: TSH: 4.22 u[IU]/mL (ref 0.450–4.500)

## 2019-04-09 LAB — VITAMIN D 25 HYDROXY (VIT D DEFICIENCY, FRACTURES): Vit D, 25-Hydroxy: 27.7 ng/mL — ABNORMAL LOW (ref 30.0–100.0)

## 2019-04-10 ENCOUNTER — Other Ambulatory Visit: Payer: Self-pay | Admitting: Certified Nurse Midwife

## 2019-04-10 DIAGNOSIS — E559 Vitamin D deficiency, unspecified: Secondary | ICD-10-CM

## 2019-04-12 DIAGNOSIS — N3010 Interstitial cystitis (chronic) without hematuria: Secondary | ICD-10-CM | POA: Diagnosis not present

## 2019-04-12 DIAGNOSIS — F419 Anxiety disorder, unspecified: Secondary | ICD-10-CM | POA: Diagnosis not present

## 2019-06-10 DIAGNOSIS — Z03818 Encounter for observation for suspected exposure to other biological agents ruled out: Secondary | ICD-10-CM | POA: Diagnosis not present

## 2019-06-21 DIAGNOSIS — Z832 Family history of diseases of the blood and blood-forming organs and certain disorders involving the immune mechanism: Secondary | ICD-10-CM | POA: Diagnosis not present

## 2019-06-21 DIAGNOSIS — Z23 Encounter for immunization: Secondary | ICD-10-CM | POA: Diagnosis not present

## 2019-06-25 ENCOUNTER — Telehealth: Payer: Self-pay | Admitting: Certified Nurse Midwife

## 2019-06-25 NOTE — Telephone Encounter (Signed)
Would recommend staying on 1/2 patch weekly until these symptoms improve and then work once weekly. This may take 2- 3 weeks. Continue prometriium

## 2019-06-25 NOTE — Telephone Encounter (Signed)
Patient left voicemail during lunch stating that she has questions for a nurse regarding coming off of progestin.

## 2019-06-25 NOTE — Telephone Encounter (Signed)
Spoke with patient. Patient has started to wean off of HRT as discussed at 04/08/19 AEX. She is in her 2nd week of 1/2 patch twice weekly, taking Prometrium daily.    Patient reports since she has started to wean she is having trouble staying asleep and and feeling "weepy" at times. Denies any other symptoms. Patient states she feels now is the time to come off of the HRT.   Patient is asking if she continue 1/2 patch twice weekly? Or other recommendations?   Melvia Heaps, CNM -please advise.

## 2019-06-25 NOTE — Telephone Encounter (Signed)
Spoke with patient, advised as seen below per Deborah Leonard, CNM. Patient verbalizes understanding and is agreeable.   Encounter closed.  

## 2019-06-30 DIAGNOSIS — J209 Acute bronchitis, unspecified: Secondary | ICD-10-CM | POA: Diagnosis not present

## 2019-09-24 DIAGNOSIS — M5412 Radiculopathy, cervical region: Secondary | ICD-10-CM

## 2019-09-24 DIAGNOSIS — M542 Cervicalgia: Secondary | ICD-10-CM | POA: Diagnosis not present

## 2019-09-24 DIAGNOSIS — Z981 Arthrodesis status: Secondary | ICD-10-CM

## 2019-09-24 DIAGNOSIS — F419 Anxiety disorder, unspecified: Secondary | ICD-10-CM

## 2019-09-24 HISTORY — DX: Anxiety disorder, unspecified: F41.9

## 2019-09-24 HISTORY — DX: Arthrodesis status: Z98.1

## 2019-09-24 HISTORY — DX: Radiculopathy, cervical region: M54.12

## 2019-10-08 ENCOUNTER — Telehealth: Payer: Self-pay | Admitting: Certified Nurse Midwife

## 2019-10-08 DIAGNOSIS — E559 Vitamin D deficiency, unspecified: Secondary | ICD-10-CM

## 2019-10-08 NOTE — Telephone Encounter (Signed)
Per review of Epic, VIt D on 04/04/19 was 27.7. OTC Vit D 1000 IU daily was recommended, recheck in 6 mo.   Spoke with patient, advised as seen above. Patient agreeable to r/s lab appt. Lab scheduled for 10/14/19 at 10:55am. Lab order placed.   Patient would like Melvia Heaps, CNM to know she has weaned herself off of Prometrium and estradiol patch, is doing well.   Routing to provider for final review. Patient is agreeable to disposition. Will close encounter.

## 2019-10-08 NOTE — Telephone Encounter (Signed)
Patient cancelled upcoming lab appointment for 10/11/2019. Would like to know what labs it was for before rescheduling. Please advise.

## 2019-10-09 DIAGNOSIS — M5412 Radiculopathy, cervical region: Secondary | ICD-10-CM | POA: Diagnosis not present

## 2019-10-11 ENCOUNTER — Other Ambulatory Visit: Payer: BC Managed Care – PPO

## 2019-10-11 DIAGNOSIS — M542 Cervicalgia: Secondary | ICD-10-CM | POA: Diagnosis not present

## 2019-10-14 ENCOUNTER — Telehealth: Payer: Self-pay | Admitting: *Deleted

## 2019-10-14 ENCOUNTER — Other Ambulatory Visit (INDEPENDENT_AMBULATORY_CARE_PROVIDER_SITE_OTHER): Payer: PPO

## 2019-10-14 ENCOUNTER — Other Ambulatory Visit: Payer: Self-pay

## 2019-10-14 DIAGNOSIS — E559 Vitamin D deficiency, unspecified: Secondary | ICD-10-CM

## 2019-10-14 NOTE — Telephone Encounter (Signed)
Spoke with patient while in office for labs. Patient wants to report to Melvia Heaps, CNM that she has successfully weaned off of all of her HRT. She is doinf well, may have had 1-2 hot flashes. Has been off for 3 wks now. Patient is aware to call the office if any future concerns.   Routing to provider for final review. Patient is agreeable to disposition. Will close encounter.

## 2019-10-15 ENCOUNTER — Telehealth: Payer: Self-pay

## 2019-10-15 LAB — VITAMIN D 25 HYDROXY (VIT D DEFICIENCY, FRACTURES): Vit D, 25-Hydroxy: 27 ng/mL — ABNORMAL LOW (ref 30.0–100.0)

## 2019-10-15 NOTE — Telephone Encounter (Signed)
Patient notified of results. See lab 

## 2019-10-15 NOTE — Telephone Encounter (Signed)
Left message for call back.

## 2019-10-15 NOTE — Telephone Encounter (Signed)
-----   Message from Regina Eck, CNM sent at 10/15/2019  7:34 AM EST ----- Notify patient her Vitamin D is still low. Is she not taking 2000 IU Vitamin D 3 daily. She should start on this to bring to better range for menopausal bone support.

## 2019-11-01 ENCOUNTER — Ambulatory Visit: Payer: BC Managed Care – PPO

## 2019-11-04 DIAGNOSIS — F419 Anxiety disorder, unspecified: Secondary | ICD-10-CM | POA: Diagnosis not present

## 2019-11-04 DIAGNOSIS — M5090 Cervical disc disorder, unspecified, unspecified cervical region: Secondary | ICD-10-CM | POA: Diagnosis not present

## 2019-11-06 ENCOUNTER — Ambulatory Visit: Payer: BC Managed Care – PPO

## 2019-11-09 ENCOUNTER — Ambulatory Visit: Payer: PPO | Attending: Internal Medicine

## 2019-11-09 DIAGNOSIS — Z23 Encounter for immunization: Secondary | ICD-10-CM | POA: Insufficient documentation

## 2019-11-09 NOTE — Progress Notes (Signed)
   Covid-19 Vaccination Clinic  Name:  Brenda Ali    MRN: [id number] DOB: December 14, 1955  11/09/2019  [name] was observed post Covid-19 immunization for 15 minutes without incidence. She was provided with Vaccine Information Sheet and instruction to access the V-Safe system.   [name] was instructed to call 911 with any severe reactions post vaccine: Marland Kitchen Difficulty breathing  . Swelling of your face and throat  . A fast heartbeat  . A bad rash all over your body  . Dizziness and weakness    Immunizations Administered    Name Date Dose VIS Date Route   Pfizer COVID-19 Vaccine 11/09/2019  5:20 PM 0.3 mL 09/13/2019 Intramuscular   Manufacturer: [place]   Lot: [id number]   [place]: [id number]

## 2019-11-12 DIAGNOSIS — M5030 Other cervical disc degeneration, unspecified cervical region: Secondary | ICD-10-CM

## 2019-11-12 DIAGNOSIS — M5412 Radiculopathy, cervical region: Secondary | ICD-10-CM | POA: Diagnosis not present

## 2019-11-12 DIAGNOSIS — Z981 Arthrodesis status: Secondary | ICD-10-CM | POA: Diagnosis not present

## 2019-11-12 HISTORY — DX: Other cervical disc degeneration, unspecified cervical region: M50.30

## 2019-11-21 ENCOUNTER — Other Ambulatory Visit: Payer: Self-pay | Admitting: Orthopedic Surgery

## 2019-11-21 DIAGNOSIS — Z981 Arthrodesis status: Secondary | ICD-10-CM

## 2019-12-02 DIAGNOSIS — G5603 Carpal tunnel syndrome, bilateral upper limbs: Secondary | ICD-10-CM

## 2019-12-02 HISTORY — DX: Carpal tunnel syndrome, bilateral upper limbs: G56.03

## 2019-12-04 ENCOUNTER — Ambulatory Visit: Payer: PPO | Attending: Internal Medicine

## 2019-12-04 DIAGNOSIS — Z23 Encounter for immunization: Secondary | ICD-10-CM

## 2019-12-04 NOTE — Progress Notes (Signed)
   Covid-19 Vaccination Clinic  Name:  Brenda Ali    MRN: [id number] DOB: 1956/09/27  12/04/2019  [name] was observed post Covid-19 immunization for 15 minutes without incident. She was provided with Vaccine Information Sheet and instruction to access the V-Safe system.   [name] was instructed to call 911 with any severe reactions post vaccine: Marland Kitchen Difficulty breathing  . Swelling of face and throat  . A fast heartbeat  . A bad rash all over body  . Dizziness and weakness   Immunizations Administered    Name Date Dose VIS Date Route   Pfizer COVID-19 Vaccine 12/04/2019  2:10 PM 0.3 mL 09/13/2019 Intramuscular   Manufacturer: [place]   Lot: [id number]   [place]: [id number]

## 2019-12-12 ENCOUNTER — Ambulatory Visit
Admission: RE | Admit: 2019-12-12 | Discharge: 2019-12-12 | Disposition: A | Discharge status: Home / Self Care | Payer: PPO | Source: Ambulatory Visit | Attending: Orthopedic Surgery | Admitting: Orthopedic Surgery

## 2019-12-12 ENCOUNTER — Other Ambulatory Visit: Payer: Self-pay

## 2019-12-12 DIAGNOSIS — Z981 Arthrodesis status: Secondary | ICD-10-CM

## 2019-12-12 DIAGNOSIS — M542 Cervicalgia: Secondary | ICD-10-CM | POA: Diagnosis not present

## 2019-12-12 MED ORDER — TRIAMCINOLONE ACETONIDE 40 MG/ML IJ SUSP (RADIOLOGY)
60.0000 mg | Freq: Once | INTRAMUSCULAR | Status: AC
  Administered 2019-12-12: 60 mg via EPIDURAL

## 2019-12-12 MED ORDER — IOPAMIDOL (ISOVUE-M 300) INJECTION 61%
1.0000 mL | Freq: Once | INTRAMUSCULAR | Status: AC
  Administered 2019-12-12: 1 mL via EPIDURAL

## 2019-12-12 NOTE — Discharge Instructions (Signed)

## 2019-12-13 DIAGNOSIS — M5412 Radiculopathy, cervical region: Secondary | ICD-10-CM | POA: Diagnosis not present

## 2019-12-17 DIAGNOSIS — M5412 Radiculopathy, cervical region: Secondary | ICD-10-CM | POA: Diagnosis not present

## 2019-12-18 DIAGNOSIS — H5213 Myopia, bilateral: Secondary | ICD-10-CM | POA: Diagnosis not present

## 2019-12-18 DIAGNOSIS — H2513 Age-related nuclear cataract, bilateral: Secondary | ICD-10-CM | POA: Diagnosis not present

## 2019-12-18 DIAGNOSIS — H524 Presbyopia: Secondary | ICD-10-CM | POA: Diagnosis not present

## 2019-12-20 DIAGNOSIS — M5412 Radiculopathy, cervical region: Secondary | ICD-10-CM | POA: Diagnosis not present

## 2019-12-24 ENCOUNTER — Encounter: Payer: Self-pay | Admitting: Certified Nurse Midwife

## 2019-12-24 DIAGNOSIS — M5412 Radiculopathy, cervical region: Secondary | ICD-10-CM | POA: Diagnosis not present

## 2019-12-30 DIAGNOSIS — M5412 Radiculopathy, cervical region: Secondary | ICD-10-CM | POA: Diagnosis not present

## 2020-01-01 DIAGNOSIS — M5412 Radiculopathy, cervical region: Secondary | ICD-10-CM | POA: Diagnosis not present

## 2020-01-06 DIAGNOSIS — M5412 Radiculopathy, cervical region: Secondary | ICD-10-CM | POA: Diagnosis not present

## 2020-01-13 DIAGNOSIS — M5412 Radiculopathy, cervical region: Secondary | ICD-10-CM | POA: Diagnosis not present

## 2020-01-15 DIAGNOSIS — M5412 Radiculopathy, cervical region: Secondary | ICD-10-CM | POA: Diagnosis not present

## 2020-01-22 DIAGNOSIS — M5412 Radiculopathy, cervical region: Secondary | ICD-10-CM | POA: Diagnosis not present

## 2020-02-03 DIAGNOSIS — R42 Dizziness and giddiness: Secondary | ICD-10-CM | POA: Diagnosis not present

## 2020-02-03 DIAGNOSIS — H6982 Other specified disorders of Eustachian tube, left ear: Secondary | ICD-10-CM | POA: Diagnosis not present

## 2020-02-03 DIAGNOSIS — I951 Orthostatic hypotension: Secondary | ICD-10-CM | POA: Diagnosis not present

## 2020-02-14 DIAGNOSIS — J069 Acute upper respiratory infection, unspecified: Secondary | ICD-10-CM | POA: Diagnosis not present

## 2020-02-17 DIAGNOSIS — Z981 Arthrodesis status: Secondary | ICD-10-CM | POA: Diagnosis not present

## 2020-02-17 DIAGNOSIS — M25531 Pain in right wrist: Secondary | ICD-10-CM | POA: Diagnosis not present

## 2020-02-27 DIAGNOSIS — M654 Radial styloid tenosynovitis [de Quervain]: Secondary | ICD-10-CM | POA: Diagnosis not present

## 2020-03-19 DIAGNOSIS — M654 Radial styloid tenosynovitis [de Quervain]: Secondary | ICD-10-CM | POA: Diagnosis not present

## 2020-03-19 DIAGNOSIS — M25532 Pain in left wrist: Secondary | ICD-10-CM | POA: Diagnosis not present

## 2020-04-08 ENCOUNTER — Telehealth: Payer: Self-pay | Admitting: Obstetrics & Gynecology

## 2020-04-08 NOTE — Telephone Encounter (Signed)
She was on the patch and gradually came off that with Brenda Ali. Patient states she feels hot all over her body.She wants to come in for an appointment and have blood work done.

## 2020-04-09 NOTE — Telephone Encounter (Signed)
AEX 04/2019 with DL, next scheduled 05/11/20 with SM H/O vit D deficiency PMP, no HRT TSH 4.220 in 7/20  Spoke with pt. Pt states having "internal thermostat" every day that started a couple of months ago. Pt denies its hot flashes, just feeling "hot". Pt states weaned off [place] Dot patch Oct/Nov 2020. Did not have any issues with hormone changes again until May 2021. Pt denies fever, chills, recent sickness, night sweats or any other vasomotor sx.  Pt requesting to be seen for OV and possible hormone lab testing. Pt currently is not taking any hormone OTC replacement or supplements.   Pt scheduled for OV with Dr Sabra Heck on 7/12 at 1130 am. Pt agreeable and verbalized understanding of date and time of appt.

## 2020-04-09 NOTE — Progress Notes (Deleted)
GYNECOLOGY  VISIT  CC:   ***  HPI: 64 y.o. G14P1011 Married White or Caucasian female here for consult & possible hormone labwork.  GYNECOLOGIC HISTORY: Patient's last menstrual period was 10/13/2011. Contraception: *** Menopausal hormone therapy: ***  Patient Active Problem List   Diagnosis Date Noted  . Subserous leiomyoma of uterus 01/01/2016  . Postmenopausal HRT (hormone replacement therapy) 01/01/2016    Past Medical History:  Diagnosis Date  . Endometriosis    Adherent R Ovary  . Infertility, female   . Interstitial cystitis   . LGSIL Pap smear of vagina 10/26/11   HPV Negative; Colpo Negative  . Panic disorder     Past Surgical History:  Procedure Laterality Date  . CERVICAL FUSION  06/2011  . COLPOSCOPY  11/16/11   Due to LGSIL - HPV;  benign  . DIAGNOSTIC LAPAROSCOPY      MEDS:   Current Outpatient Medications on File Prior to Visit  Medication Sig Dispense Refill  . clonazePAM (KLONOPIN) 0.5 MG tablet daily.  0  . gabapentin (NEURONTIN) 300 MG capsule Take 300 mg by mouth at bedtime.     No current facility-administered medications on file prior to visit.    ALLERGIES: Penicillins and Sulfa antibiotics  Family History  Problem Relation Age of Onset  . Endometrial cancer Sister 10  . Other Brother        early onset of alzheimers    SH:  ***  Review of Systems  PHYSICAL EXAMINATION:    LMP 10/13/2011     General appearance: alert, cooperative and appears stated age Neck: no adenopathy, supple, symmetrical, trachea midline and thyroid {CHL AMB PHY EX THYROID NORM [phone]::"normal to inspection and palpation"} CV:  {Exam; heart brief:31539} Lungs:  {pe lungs ob:314451::"clear to auscultation, no wheezes, rales or rhonchi, symmetric air entry"} Breasts: {Exam; breast:13139::"normal appearance, no masses or tenderness"} Abdomen: soft, non-tender; bowel sounds normal; no masses,  no organomegaly Lymph:  no inguinal LAD noted  Pelvic:  External genitalia:  no lesions              Urethra:  normal appearing urethra with no masses, tenderness or lesions              Bartholins and Skenes: normal                 Vagina: normal appearing vagina with normal color and discharge, no lesions              Cervix: {CHL AMB PHY EX CERVIX NORM DEFAULT:[id number]::"no lesions"}              Bimanual Exam:  Uterus:  {CHL AMB PHY EX UTERUS NORM DEFAULT:[phone]::"normal size, contour, position, consistency, mobility, non-tender"}              Adnexa: {CHL AMB PHY EX ADNEXA NO MASS DEFAULT:[phone]::"no mass, fullness, tenderness"}              Rectovaginal: {yes no:314532}.  Confirms.              Anus:  normal sphincter tone, no lesions  Chaperone, ***Terence Lux, CMA, was present for exam.  Assessment: ***  Plan: ***   ~{NUMBERS; -10-45 JOINT ROM:10287} minutes spent with patient >50% of time was in face to face discussion of above.

## 2020-04-09 NOTE — Telephone Encounter (Signed)
Left message for pt to return call to triage RN. 

## 2020-04-13 ENCOUNTER — Ambulatory Visit: Payer: BC Managed Care – PPO | Admitting: Certified Nurse Midwife

## 2020-04-13 ENCOUNTER — Telehealth: Payer: Self-pay | Admitting: Obstetrics & Gynecology

## 2020-04-13 ENCOUNTER — Ambulatory Visit: Payer: PPO | Admitting: Obstetrics & Gynecology

## 2020-04-13 NOTE — Telephone Encounter (Signed)
Spoke with patient. OV rescheduled to 7/21 at 9:30am with Dr. Sabra Heck.   Encounter closed.

## 2020-04-13 NOTE — Telephone Encounter (Signed)
Left message for patient to call and reschedule her hormone lab appointment due to md in surgery.

## 2020-04-21 ENCOUNTER — Other Ambulatory Visit: Payer: Self-pay | Admitting: Obstetrics & Gynecology

## 2020-04-21 DIAGNOSIS — Z1231 Encounter for screening mammogram for malignant neoplasm of breast: Secondary | ICD-10-CM

## 2020-04-21 NOTE — Progress Notes (Signed)
GYNECOLOGY  VISIT  CC: Vasomotor symptoms   HPI: 64 y.o. G19P1011 Married White or Caucasian female here for vasomotor symptoms.  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  After retirement, she slowly weaned off HRT after July.  She last used a patch in October.  Now she is experiencing what she describes as feeling like she feels like she is "internally smothering" and she will start becoming very hard.  She would like blood work today to check her hormone levels.  She is drinking a little more white wine in the evenings.  She will have some increased hot flashes with this.    She has recently started Neurontin 300mg  nightly.  She started this on January.  She is on this for some neck and arm nerve issues.  Has hx of prior cervical fusion in 2012.  After retirement, reports having a lot more time for exercise and, at some point, in the last few months has overdone her exercise and now having some neck pain, arm and shoulder numbness/tingling.  She is seeing ortho.  Was started on the Neurontin to help with the nerve issues.  Pt would like to do some blood work as well today to make sure "nothing else is wrong".    GYNECOLOGIC HISTORY: Patient's last menstrual period was 10/13/2011. Contraception: none  Menopausal hormone therapy: none   Patient Active Problem List   Diagnosis Date Noted  . Subserous leiomyoma of uterus 01/01/2016  . Postmenopausal HRT (hormone replacement therapy) 01/01/2016    Past Medical History:  Diagnosis Date  . Endometriosis    Adherent R Ovary  . Infertility, female   . Interstitial cystitis   . LGSIL Pap smear of vagina 10/26/11   HPV Negative; Colpo Negative  . Panic disorder     Past Surgical History:  Procedure Laterality Date  . CERVICAL FUSION  06/2011  . COLPOSCOPY  11/16/11   Due to LGSIL - HPV;  benign  . DIAGNOSTIC LAPAROSCOPY      MEDS:   Current Outpatient Medications on File Prior to Visit   Medication Sig Dispense Refill  . clonazePAM (KLONOPIN) 0.5 MG tablet daily.  0  . gabapentin (NEURONTIN) 300 MG capsule Take 300 mg by mouth at bedtime.     No current facility-administered medications on file prior to visit.    ALLERGIES: Penicillins and Sulfa antibiotics  Family History  Problem Relation Age of Onset  . Endometrial cancer Sister 34  . Other Brother        early onset of alzheimers    SH:  Married, non smoker  Review of Systems  All other systems reviewed and are negative.   PHYSICAL EXAMINATION:    BP 108/62   Pulse 97   Wt 127 lb (57.6 kg)   LMP 10/13/2011   SpO2 99%   BMI 23.42 kg/m     General appearance: alert, cooperative and appears stated age   Assessment: Vasomotor symptoms after stopping HRT in the fall of 2020 H/o cervical fusion, now with some recent arm/shoulder nerve pain/tingling  Plan: Estradiol, CMP, Vit D and TSH obtained today If these are normal, will increase Neurontin by 100mg  at night and also considering additional additional dosing during the day.  Pt comfortable with this plan.   22 minutes spent in total with pt.

## 2020-04-22 ENCOUNTER — Encounter: Payer: Self-pay | Admitting: Obstetrics & Gynecology

## 2020-04-22 ENCOUNTER — Ambulatory Visit: Payer: PPO | Admitting: Obstetrics & Gynecology

## 2020-04-22 ENCOUNTER — Other Ambulatory Visit: Payer: Self-pay

## 2020-04-22 VITALS — BP 108/62 | HR 97 | Wt 127.0 lb

## 2020-04-22 DIAGNOSIS — R232 Flushing: Secondary | ICD-10-CM

## 2020-04-23 ENCOUNTER — Telehealth: Payer: Self-pay | Admitting: *Deleted

## 2020-04-23 LAB — COMPREHENSIVE METABOLIC PANEL
ALT: 14 IU/L (ref 0–32)
AST: 20 IU/L (ref 0–40)
Albumin/Globulin Ratio: 2 (ref 1.2–2.2)
Albumin: 4.7 g/dL (ref 3.8–4.8)
Alkaline Phosphatase: 82 IU/L (ref 48–121)
BUN/Creatinine Ratio: 18 (ref 12–28)
BUN: 16 mg/dL (ref 8–27)
Bilirubin Total: 0.5 mg/dL (ref 0.0–1.2)
CO2: 25 mmol/L (ref 20–29)
Calcium: 10 mg/dL (ref 8.7–10.3)
Chloride: 104 mmol/L (ref 96–106)
Creatinine, Ser: 0.9 mg/dL (ref 0.57–1.00)
GFR calc Af Amer: 78 mL/min/{1.73_m2} (ref >59)
GFR calc non Af Amer: 67 mL/min/{1.73_m2} (ref >59)
Globulin, Total: 2.3 g/dL (ref 1.5–4.5)
Glucose: 83 mg/dL (ref 65–99)
Potassium: 4.9 mmol/L (ref 3.5–5.2)
Sodium: 142 mmol/L (ref 134–144)
Total Protein: 7 g/dL (ref 6.0–8.5)

## 2020-04-23 LAB — TSH: TSH: 3.41 u[IU]/mL (ref 0.450–4.500)

## 2020-04-23 LAB — ESTRADIOL: Estradiol: <5 pg/mL

## 2020-04-23 LAB — VITAMIN D 25 HYDROXY (VIT D DEFICIENCY, FRACTURES): Vit D, 25-Hydroxy: 33.4 ng/mL (ref 30.0–100.0)

## 2020-04-23 MED ORDER — GABAPENTIN 100 MG PO CAPS: 100.0000 mg | ORAL_CAPSULE | Freq: Every day | ORAL | 0 refills | Status: DC

## 2020-04-23 NOTE — Telephone Encounter (Signed)
Spoke with patient, advised as seen below per Dr. Sabra Heck. Patient agreeable to plan, Rx to verified pharmacy. Patient verbalizes understanding and is agreeable.   Routing to provider for final review. Patient is agreeable to disposition. Will close encounter.

## 2020-04-23 NOTE — Telephone Encounter (Signed)
Patient returned call

## 2020-04-23 NOTE — Telephone Encounter (Signed)
-----   Message from Megan Salon, MD sent at 04/23/2020  7:37 AM EDT ----- Please let pt know her lab work is normal.  Estradiol level <5 but this is what is should be with menopause.  We discussed adding some gabapentin.  She is taking 300mg  nightly for a neck/shoulder issue.  Ok to add 100mg  at night and increase again in 1 week.  Can call and give update.  Will need rx for 100mg , #60/0RF to pharmacy.  Thanks.  CC:  Royal Hawthorn, CMA

## 2020-04-23 NOTE — Telephone Encounter (Signed)
Burnice Logan, RN  04/23/2020 1:59 PM EDT Back to Top    Left message to call Sharee Pimple, RN at [facility].

## 2020-04-29 ENCOUNTER — Ambulatory Visit
Admission: RE | Admit: 2020-04-29 | Discharge: 2020-04-29 | Disposition: A | Discharge status: Home / Self Care | Payer: PPO | Source: Ambulatory Visit | Attending: Obstetrics & Gynecology | Admitting: Obstetrics & Gynecology

## 2020-04-29 ENCOUNTER — Other Ambulatory Visit: Payer: Self-pay

## 2020-04-29 DIAGNOSIS — Z1231 Encounter for screening mammogram for malignant neoplasm of breast: Secondary | ICD-10-CM

## 2020-05-04 NOTE — Progress Notes (Deleted)
64 y.o. G13P1011 Married White or Caucasian female here for annual exam.    Patient's last menstrual period was 10/13/2011.          Sexually active: {yes no:314532}  The current method of family planning is post menopausal status.    Exercising: {yes no:314532}  {types:19826} Smoker:  {YES NO:22349}  Health Maintenance: Pap:   01-04-18 neg HPV HR neg  12/17/15 Neg History of abnormal Pap:  yes MMG:  04/29/20 BIRADS 1 negative/density c Colonoscopy:  January 2019 Negative BMD:   2018 Normal TDaP:  12/23/16 Pneumonia vaccine(s):  *** Shingrix:   *** Hep C testing: 12/29/16 Negative Screening Labs: ***   reports that she has never smoked. She has never used smokeless tobacco. She reports current alcohol use of about 2.0 - 3.0 standard drinks of alcohol per week. She reports that she does not use drugs.  Past Medical History:  Diagnosis Date  . Endometriosis    Adherent R Ovary  . Infertility, female   . Interstitial cystitis   . LGSIL Pap smear of vagina 10/26/11   HPV Negative; Colpo Negative  . Panic disorder     Past Surgical History:  Procedure Laterality Date  . CERVICAL FUSION  06/2011  . COLPOSCOPY  11/16/11   Due to LGSIL - HPV;  benign  . DIAGNOSTIC LAPAROSCOPY      Current Outpatient Medications  Medication Sig Dispense Refill  . clonazePAM (KLONOPIN) 0.5 MG tablet daily.  0  . gabapentin (NEURONTIN) 100 MG capsule Take 1 capsule (100 mg total) by mouth at bedtime. 60 capsule 0  . gabapentin (NEURONTIN) 300 MG capsule Take 300 mg by mouth at bedtime.     No current facility-administered medications for this visit.    Family History  Problem Relation Age of Onset  . Endometrial cancer Sister 3  . Other Brother        early onset of alzheimers    Review of Systems  Exam:   LMP 10/13/2011      General appearance: alert, cooperative and appears stated age Head: Normocephalic, without obvious abnormality, atraumatic Neck: no adenopathy, supple, symmetrical,  trachea midline and thyroid {EXAM; THYROID:18604} Lungs: clear to auscultation bilaterally Breasts: {Exam; breast:13139::"normal appearance, no masses or tenderness"} Heart: regular rate and rhythm Abdomen: soft, non-tender; bowel sounds normal; no masses,  no organomegaly Extremities: extremities normal, atraumatic, no cyanosis or edema Skin: Skin color, texture, turgor normal. No rashes or lesions Lymph nodes: Cervical, supraclavicular, and axillary nodes normal. No abnormal inguinal nodes palpated Neurologic: Grossly normal   Pelvic: External genitalia:  no lesions              Urethra:  normal appearing urethra with no masses, tenderness or lesions              Bartholins and Skenes: normal                 Vagina: normal appearing vagina with normal color and discharge, no lesions              Cervix: {exam; cervix:14595}              Pap taken: {yes no:314532} Bimanual Exam:  Uterus:  {exam; uterus:12215}              Adnexa: {exam; adnexa:12223}               Rectovaginal: Confirms               Anus:  normal sphincter tone, no lesions  Chaperone, ***Terence Lux, CMA, was present for exam.  A:  Well Woman with normal exam  P:   {plan; gyn:5269::"mammogram","pap smear","return annually or prn"}

## 2020-05-06 ENCOUNTER — Ambulatory Visit: Payer: PPO | Admitting: Obstetrics & Gynecology

## 2020-05-11 ENCOUNTER — Ambulatory Visit: Payer: PPO | Admitting: Obstetrics & Gynecology

## 2020-05-12 NOTE — Progress Notes (Signed)
64 y.o. G48P1011 Married White or Caucasian female here for breast/pelvic exam and to have follow up after discussion of hot flashes on 04/22/2020.  Pt realized after this discussion that stressors have really played a part in her hot flashes.  She was on gabapentin from ortho but changed/added dosage after seeing me.  reports she is doing very well with this.  Ortho has asked if I will continue prescriptions.    Pt states stressors are no better but she is much more aware of how it is affecting her.  Mother is moving from [place] to [place] to assisted living.  there is family in [place].  Pt is going to be listing her house this week and will be moving to [place] to be nearer family/granddaughters.  Possible options for care in [place] discussed.  Denies vaginal bleeding.    Also, has new concern of some occasional urinary frequency.  Denies dysuria.  Denies pelvic pain.  Denies back pain.  Desires urine testing.    PCP:  Dr. Stephanie Acre.  Did have wellness exam and lab work with her earlier this year.  Patient's last menstrual period was 10/13/2011.          Sexually active: Yes.    The current method of family planning is post menopausal status.    Exercising: Yes.    tennis, walking  Health Maintenance: Pap:  01-04-18 neg HPV HR neg History of abnormal Pap:  Yes, LGSIL with neg HR HPV 2013.  Colpo negative MMG:  05-01-2020 category c density birads 1:neg Colonoscopy:  1/19 neg   reports that she has never smoked. She has never used smokeless tobacco. She reports current alcohol use. She reports that she does not use drugs.  Past Medical History:  Diagnosis Date  . Endometriosis    Adherent R Ovary  . Infertility, female   . Interstitial cystitis   . LGSIL Pap smear of vagina 10/26/11   HPV Negative; Colpo Negative  . Panic disorder     Past Surgical History:  Procedure Laterality Date  . CERVICAL FUSION  06/2011  . COLPOSCOPY  11/16/11   Due to LGSIL - HPV;  benign  . DIAGNOSTIC LAPAROSCOPY       Current Outpatient Medications  Medication Sig Dispense Refill  . clonazePAM (KLONOPIN) 0.5 MG tablet daily.  0  . gabapentin (NEURONTIN) 100 MG capsule Take 1 capsule (100 mg total) by mouth at bedtime. 60 capsule 0  . gabapentin (NEURONTIN) 300 MG capsule Take 300 mg by mouth at bedtime.     No current facility-administered medications for this visit.    Family History  Problem Relation Age of Onset  . Endometrial cancer Sister 53  . Other Brother        early onset of alzheimers    Review of Systems  Constitutional: Negative.   HENT: Negative.   Eyes: Negative.   Respiratory: Negative.   Cardiovascular: Negative.   Gastrointestinal: Negative.   Endocrine: Negative.   Genitourinary: Negative.   Musculoskeletal: Negative.   Skin: Negative.   Allergic/Immunologic: Negative.   Neurological: Negative.   Hematological: Negative.   Psychiatric/Behavioral: Negative.     Exam:   BP 104/64   Pulse 68   Resp 16   Ht 5' 1.5" (1.562 m)   Wt 125 lb (56.7 kg)   LMP 10/13/2011   BMI 23.24 kg/m   Height: 5' 1.5" (156.2 cm)  General appearance: alert, cooperative and appears stated age Breasts: normal appearance, no masses or tenderness  Heart: regular rate and rhythm Abdomen: soft, non-tender; bowel sounds normal; no masses,  no organomegaly Extremities: extremities normal, atraumatic, no  Lymph nodes: No abnormal inguinal nodes palpated Neurologic: Grossly normal   Pelvic: External genitalia:  no lesions              Urethra:  normal appearing urethra with no masses, tenderness or lesions              Bartholins and Skenes: normal                 Vagina: normal appearing vagina with normal color and discharge, no lesions              Cervix: no lesions              Pap taken: No. Bimanual Exam:  Uterus:  normal size, contour, position, consistency, mobility, non-tender              Adnexa: no mass, fullness, tenderness               Rectovaginal: Confirms                Anus:  normal sphincter tone, no lesions  Chaperone, Royal Hawthorn, CMA, was present for exam.  A:  Breast/pelvic exam Hot flashes with improvement using gabapentin Social stressors H/o LGSIL pap with neg HR HVP 1/13, negative colpo Interstitial cystitis H/o endometriosis   P:   Mammogram guidelines reviewed.  Yearly recommended. pap smear neg with neg HR HPV 2019.  Not indicated today RF for gabapentin 300mg  nightly and 100mg  in am.  90 day supply with RF. Discussed possible care in [place] area Lab work, colonoscopy screening, vaccines management with Dr. Stephanie Acre. return annually or prn  23 minutes total spent with pt

## 2020-05-18 ENCOUNTER — Telehealth: Payer: Self-pay

## 2020-05-18 NOTE — Telephone Encounter (Signed)
Per review of Epic, last CMP on 04/22/20 WNL.  Scheduled for AEX 05/19/20 at 1pm.   Call returned to patient, left detailed message, name identified on voicemail, ok per dpr. Advised CMP completed on 04/22/20, this lab includes liver and kidney function, WNL. Can plan to further discuss labs during AEX with Dr. Sabra Heck, additional labs can be completed at that time if needed. Return call to office if any additional questions in the meantime.   Routing to Dr. Lestine Box.   Encounter closed.

## 2020-05-18 NOTE — Telephone Encounter (Signed)
Patient left message wanting to get kidney and liver labs done at tomorrows AEX.

## 2020-05-19 ENCOUNTER — Other Ambulatory Visit: Payer: Self-pay

## 2020-05-19 ENCOUNTER — Encounter: Payer: Self-pay | Admitting: Obstetrics & Gynecology

## 2020-05-19 ENCOUNTER — Ambulatory Visit (INDEPENDENT_AMBULATORY_CARE_PROVIDER_SITE_OTHER): Payer: PPO | Admitting: Obstetrics & Gynecology

## 2020-05-19 VITALS — BP 104/64 | HR 68 | Resp 16 | Ht 61.5 in | Wt 125.0 lb

## 2020-05-19 DIAGNOSIS — Z01419 Encounter for gynecological examination (general) (routine) without abnormal findings: Secondary | ICD-10-CM | POA: Diagnosis not present

## 2020-05-19 DIAGNOSIS — Z0000 Encounter for general adult medical examination without abnormal findings: Secondary | ICD-10-CM

## 2020-05-19 DIAGNOSIS — R232 Flushing: Secondary | ICD-10-CM | POA: Diagnosis not present

## 2020-05-19 LAB — POCT URINALYSIS DIPSTICK
Bilirubin, UA: NEGATIVE
Blood, UA: NEGATIVE
Glucose, UA: NEGATIVE
Ketones, UA: NEGATIVE
Leukocytes, UA: NEGATIVE
Nitrite, UA: NEGATIVE
Protein, UA: NEGATIVE
Urobilinogen, UA: NEGATIVE E.U./dL — AB
pH, UA: 5 (ref 5.0–8.0)

## 2020-05-19 MED ORDER — GABAPENTIN 300 MG PO CAPS: 300.0000 mg | ORAL_CAPSULE | Freq: Every day | ORAL | 4 refills | Status: DC

## 2020-05-19 MED ORDER — GABAPENTIN 100 MG PO CAPS: 100.0000 mg | ORAL_CAPSULE | Freq: Every day | ORAL | 4 refills | Status: DC

## 2020-05-19 NOTE — Patient Instructions (Signed)
[facility] ob/gyn Novella Rob, MD

## 2020-05-26 ENCOUNTER — Encounter: Payer: Self-pay | Admitting: Obstetrics & Gynecology

## 2020-05-26 DIAGNOSIS — M5090 Cervical disc disorder, unspecified, unspecified cervical region: Secondary | ICD-10-CM | POA: Diagnosis not present

## 2020-05-26 DIAGNOSIS — Z0000 Encounter for general adult medical examination without abnormal findings: Secondary | ICD-10-CM | POA: Diagnosis not present

## 2020-05-26 DIAGNOSIS — E2839 Other primary ovarian failure: Secondary | ICD-10-CM | POA: Diagnosis not present

## 2020-05-26 DIAGNOSIS — Z23 Encounter for immunization: Secondary | ICD-10-CM | POA: Diagnosis not present

## 2020-05-26 DIAGNOSIS — Z136 Encounter for screening for cardiovascular disorders: Secondary | ICD-10-CM | POA: Diagnosis not present

## 2020-05-26 DIAGNOSIS — G479 Sleep disorder, unspecified: Secondary | ICD-10-CM | POA: Diagnosis not present

## 2020-05-26 DIAGNOSIS — F419 Anxiety disorder, unspecified: Secondary | ICD-10-CM | POA: Diagnosis not present

## 2020-05-26 DIAGNOSIS — I7300 Raynaud's syndrome without gangrene: Secondary | ICD-10-CM | POA: Diagnosis not present

## 2020-05-26 DIAGNOSIS — Z79899 Other long term (current) drug therapy: Secondary | ICD-10-CM | POA: Diagnosis not present

## 2020-05-26 DIAGNOSIS — N3010 Interstitial cystitis (chronic) without hematuria: Secondary | ICD-10-CM | POA: Diagnosis not present

## 2020-06-16 ENCOUNTER — Telehealth: Payer: Self-pay | Admitting: Obstetrics & Gynecology

## 2020-06-16 NOTE — Telephone Encounter (Signed)
Patient is returning call.  °

## 2020-06-16 NOTE — Telephone Encounter (Signed)
Patient has a question about her gabapentin prescription.

## 2020-06-16 NOTE — Telephone Encounter (Signed)
Spoke with Brenda Ali. Brenda Ali states wanting to know if Dr Sabra Heck sent in new Rx for gabapentin from [facility] on 05/19/20? Brenda Ali advised that Dr Sabra Heck sent in new Rx . Pharmacy verified. Brenda Ali agreeable and verbalized understanding.  Encounter closed.

## 2020-06-16 NOTE — Telephone Encounter (Signed)
Left message for pt to return call to triage RN. 

## 2020-07-04 DIAGNOSIS — J22 Unspecified acute lower respiratory infection: Secondary | ICD-10-CM | POA: Diagnosis not present

## 2020-07-04 DIAGNOSIS — J019 Acute sinusitis, unspecified: Secondary | ICD-10-CM | POA: Diagnosis not present

## 2020-09-14 DIAGNOSIS — G5602 Carpal tunnel syndrome, left upper limb: Secondary | ICD-10-CM | POA: Diagnosis not present

## 2020-09-14 DIAGNOSIS — G5601 Carpal tunnel syndrome, right upper limb: Secondary | ICD-10-CM | POA: Diagnosis not present

## 2020-09-14 DIAGNOSIS — G5603 Carpal tunnel syndrome, bilateral upper limbs: Secondary | ICD-10-CM | POA: Diagnosis not present

## 2020-09-14 DIAGNOSIS — M5030 Other cervical disc degeneration, unspecified cervical region: Secondary | ICD-10-CM | POA: Diagnosis not present

## 2020-09-14 DIAGNOSIS — M5412 Radiculopathy, cervical region: Secondary | ICD-10-CM | POA: Diagnosis not present

## 2020-10-08 DIAGNOSIS — G5602 Carpal tunnel syndrome, left upper limb: Secondary | ICD-10-CM | POA: Diagnosis not present

## 2020-10-10 DIAGNOSIS — G5603 Carpal tunnel syndrome, bilateral upper limbs: Secondary | ICD-10-CM | POA: Diagnosis not present

## 2020-10-15 DIAGNOSIS — G5603 Carpal tunnel syndrome, bilateral upper limbs: Secondary | ICD-10-CM | POA: Diagnosis not present

## 2020-10-15 DIAGNOSIS — M654 Radial styloid tenosynovitis [de Quervain]: Secondary | ICD-10-CM | POA: Diagnosis not present

## 2020-11-03 DIAGNOSIS — G5603 Carpal tunnel syndrome, bilateral upper limbs: Secondary | ICD-10-CM | POA: Diagnosis not present

## 2020-11-03 DIAGNOSIS — M654 Radial styloid tenosynovitis [de Quervain]: Secondary | ICD-10-CM | POA: Diagnosis not present

## 2020-11-03 DIAGNOSIS — G5601 Carpal tunnel syndrome, right upper limb: Secondary | ICD-10-CM | POA: Diagnosis not present

## 2020-12-01 DIAGNOSIS — M65312 Trigger thumb, left thumb: Secondary | ICD-10-CM | POA: Diagnosis not present

## 2020-12-28 DIAGNOSIS — F419 Anxiety disorder, unspecified: Secondary | ICD-10-CM | POA: Diagnosis not present

## 2021-01-04 DIAGNOSIS — H04123 Dry eye syndrome of bilateral lacrimal glands: Secondary | ICD-10-CM | POA: Diagnosis not present

## 2021-01-04 DIAGNOSIS — H524 Presbyopia: Secondary | ICD-10-CM | POA: Diagnosis not present

## 2021-01-04 DIAGNOSIS — H43813 Vitreous degeneration, bilateral: Secondary | ICD-10-CM | POA: Diagnosis not present

## 2021-01-04 DIAGNOSIS — H2513 Age-related nuclear cataract, bilateral: Secondary | ICD-10-CM | POA: Diagnosis not present

## 2021-02-25 DIAGNOSIS — G5603 Carpal tunnel syndrome, bilateral upper limbs: Secondary | ICD-10-CM | POA: Diagnosis not present

## 2021-02-25 DIAGNOSIS — M65312 Trigger thumb, left thumb: Secondary | ICD-10-CM | POA: Diagnosis not present

## 2021-03-12 DIAGNOSIS — J069 Acute upper respiratory infection, unspecified: Secondary | ICD-10-CM | POA: Diagnosis not present

## 2021-03-13 DIAGNOSIS — Z20822 Contact with and (suspected) exposure to covid-19: Secondary | ICD-10-CM | POA: Diagnosis not present

## 2021-03-16 DIAGNOSIS — G5603 Carpal tunnel syndrome, bilateral upper limbs: Secondary | ICD-10-CM | POA: Diagnosis not present

## 2021-03-16 DIAGNOSIS — M65322 Trigger finger, left index finger: Secondary | ICD-10-CM | POA: Diagnosis not present

## 2021-03-16 DIAGNOSIS — G5601 Carpal tunnel syndrome, right upper limb: Secondary | ICD-10-CM | POA: Diagnosis not present

## 2021-03-30 DIAGNOSIS — L237 Allergic contact dermatitis due to plants, except food: Secondary | ICD-10-CM | POA: Diagnosis not present

## 2021-03-30 DIAGNOSIS — U071 COVID-19: Secondary | ICD-10-CM | POA: Diagnosis not present

## 2021-04-19 ENCOUNTER — Other Ambulatory Visit: Payer: Self-pay | Admitting: Obstetrics & Gynecology

## 2021-04-19 DIAGNOSIS — Z1231 Encounter for screening mammogram for malignant neoplasm of breast: Secondary | ICD-10-CM

## 2021-05-03 ENCOUNTER — Other Ambulatory Visit: Payer: Self-pay

## 2021-05-03 ENCOUNTER — Ambulatory Visit
Admission: RE | Admit: 2021-05-03 | Discharge: 2021-05-03 | Disposition: A | Discharge status: Home / Self Care | Payer: PPO | Source: Ambulatory Visit | Attending: Obstetrics & Gynecology | Admitting: Obstetrics & Gynecology

## 2021-05-03 DIAGNOSIS — Z1231 Encounter for screening mammogram for malignant neoplasm of breast: Secondary | ICD-10-CM

## 2021-06-11 DIAGNOSIS — Z0000 Encounter for general adult medical examination without abnormal findings: Secondary | ICD-10-CM | POA: Diagnosis not present

## 2021-07-01 ENCOUNTER — Encounter (HOSPITAL_BASED_OUTPATIENT_CLINIC_OR_DEPARTMENT_OTHER): Payer: Self-pay | Admitting: Obstetrics & Gynecology

## 2021-07-01 ENCOUNTER — Other Ambulatory Visit (HOSPITAL_COMMUNITY)
Admission: RE | Admit: 2021-07-01 | Discharge: 2021-07-01 | Disposition: A | Discharge status: Home / Self Care | Payer: PPO | Source: Ambulatory Visit | Attending: Obstetrics & Gynecology | Admitting: Obstetrics & Gynecology

## 2021-07-01 ENCOUNTER — Other Ambulatory Visit: Payer: Self-pay

## 2021-07-01 ENCOUNTER — Ambulatory Visit (INDEPENDENT_AMBULATORY_CARE_PROVIDER_SITE_OTHER): Payer: PPO | Admitting: Obstetrics & Gynecology

## 2021-07-01 VITALS — BP 125/66 | HR 83 | Ht 61.5 in | Wt 126.0 lb

## 2021-07-01 DIAGNOSIS — Z124 Encounter for screening for malignant neoplasm of cervix: Secondary | ICD-10-CM | POA: Diagnosis not present

## 2021-07-01 DIAGNOSIS — G4700 Insomnia, unspecified: Secondary | ICD-10-CM

## 2021-07-01 DIAGNOSIS — Z01419 Encounter for gynecological examination (general) (routine) without abnormal findings: Secondary | ICD-10-CM | POA: Diagnosis not present

## 2021-07-01 DIAGNOSIS — E7800 Pure hypercholesterolemia, unspecified: Secondary | ICD-10-CM

## 2021-07-01 DIAGNOSIS — M8580 Other specified disorders of bone density and structure, unspecified site: Secondary | ICD-10-CM

## 2021-07-01 DIAGNOSIS — Z780 Asymptomatic menopausal state: Secondary | ICD-10-CM

## 2021-07-01 NOTE — Patient Instructions (Signed)
Dr. Amedeo Plenty Dr. Oneta Rack

## 2021-07-01 NOTE — Progress Notes (Signed)
65 y.o. G16P1011 Married White or Caucasian female here for breast and pelvic exam.  Sold their home and downsized.  Now living off [address].  Was considering moving to [facility] but found this place and has decided to stay in [place].    Denies vaginal bleeding.  Last year, she was having some hot flash issues.  She is on gabapentin 300mg  nightly.  This was started due to her carpel tunnel issues.  She did try some 100mg  dosages during the day.    Patient's last menstrual period was 10/13/2011.          Sexually active: No.  H/O STD:  no  Health Maintenance: PCP:  [facility].  Last wellness appt was this summer.  Did blood work at that appt:  no Vaccines are up to date:  has not done shingrix vaccination Colonoscopy:  10/06/2017, follow up 10 years MMG:  05/03/2021 Negative BMD:  02/23/2017 Last pap smear:  01/04/2018 Negative.   H/o abnormal pap smear:  remote hx   reports that she has never smoked. She has never used smokeless tobacco. She reports current alcohol use. She reports that she does not use drugs.  Past Medical History:  Diagnosis Date   Endometriosis    Adherent R Ovary   Infertility, female    Interstitial cystitis    LGSIL Pap smear of vagina 10/26/11   HPV Negative; Colpo Negative   Panic disorder     Past Surgical History:  Procedure Laterality Date   CERVICAL FUSION  06/2011   COLPOSCOPY  11/16/11   Due to LGSIL - HPV;  benign   DIAGNOSTIC LAPAROSCOPY      Current Outpatient Medications  Medication Sig Dispense Refill   clonazePAM (KLONOPIN) 0.5 MG tablet daily.  0   gabapentin (NEURONTIN) 300 MG capsule Take 1 capsule (300 mg total) by mouth at bedtime. 90 capsule 4   No current facility-administered medications for this visit.    Family History  Problem Relation Age of Onset   Endometrial cancer Sister 42   Other Brother        early onset of alzheimers    Review of Systems  All other systems reviewed and are negative.  Exam:   BP 125/66 (BP  Location: Right Arm, Patient Position: Sitting, Cuff Size: Small)   Pulse 83   Ht 5' 1.5" (1.562 m)   Wt 126 lb (57.2 kg)   LMP 10/13/2011   BMI 23.42 kg/m   Height: 5' 1.5" (156.2 cm)  General appearance: alert, cooperative and appears stated age Breasts: normal appearance, no masses or tenderness Abdomen: soft, non-tender; bowel sounds normal; no masses,  no organomegaly Lymph nodes: Cervical, supraclavicular, and axillary nodes normal.  No abnormal inguinal nodes palpated Neurologic: Grossly normal  Pelvic: External genitalia:  no lesions              Urethra:  normal appearing urethra with no masses, tenderness or lesions              Bartholins and Skenes: normal                 Vagina: normal appearing vagina with atrophic changes and no discharge, no lesions              Cervix: no lesions              Pap taken: Yes.   Bimanual Exam:  Uterus:  normal size, contour, position, consistency, mobility, non-tender  Adnexa: normal adnexa               Rectovaginal: Confirms               Anus:  normal sphincter tone, no lesions  Chaperone, Octaviano Batty, CMA, was present for exam.  Assessment/Plan: 1. Encntr for gyn exam (general) (routine) w/o abn findings - pap smear obtained today.  Guidelines reviewed today. - MGM up to date - colonoscopy 2019, follow up 10 years - BMD discussed - vaccines/care gaps updated  2. Postmenopausal - no HRT  3. Elevated LDL cholesterol level - Lipid panel  4. Osteopenia, unspecified location - DG BONE DENSITY (DXA); Future  5. Insomnia, unspecified type - uses Klonopin nightly.  I do not write this for her.

## 2021-07-02 LAB — CYTOLOGY - PAP: Diagnosis: NEGATIVE

## 2021-07-02 LAB — LIPID PANEL
Chol/HDL Ratio: 3.2 ratio (ref 0.0–4.4)
Cholesterol, Total: 243 mg/dL — ABNORMAL HIGH (ref 100–199)
HDL: 75 mg/dL (ref >39)
LDL Chol Calc (NIH): 154 mg/dL — ABNORMAL HIGH (ref 0–99)
Triglycerides: 82 mg/dL (ref 0–149)
VLDL Cholesterol Cal: 14 mg/dL (ref 5–40)

## 2021-07-08 DIAGNOSIS — M65312 Trigger thumb, left thumb: Secondary | ICD-10-CM | POA: Diagnosis not present

## 2021-07-08 DIAGNOSIS — G5601 Carpal tunnel syndrome, right upper limb: Secondary | ICD-10-CM | POA: Diagnosis not present

## 2021-08-19 IMAGING — MG MM DIGITAL SCREENING BILAT W/ TOMO AND CAD
8 series · 9 of 24 positions shown · non-contrast
Comparison: Previous exam(s).

CLINICAL DATA: Screening.

EXAM:
DIGITAL SCREENING BILATERAL MAMMOGRAM WITH TOMOSYNTHESIS AND CAD
TECHNIQUE: Bilateral screening digital craniocaudal and mediolateral oblique
mammograms were obtained. Bilateral screening digital breast
tomosynthesis was performed. The images were evaluated with
computer-aided detection.

[L MLO synth-2D]
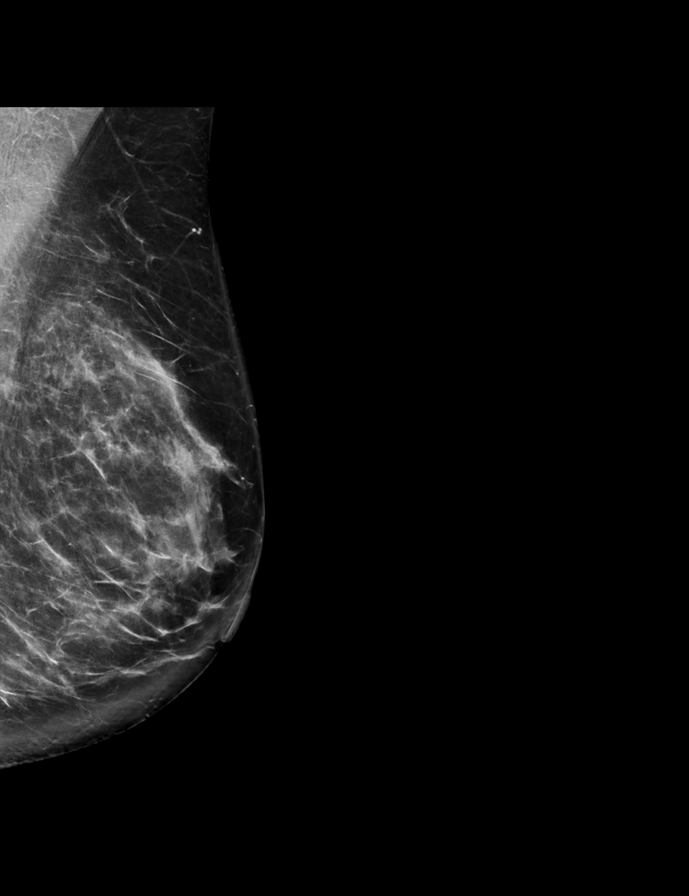

[L CC synth-2D]
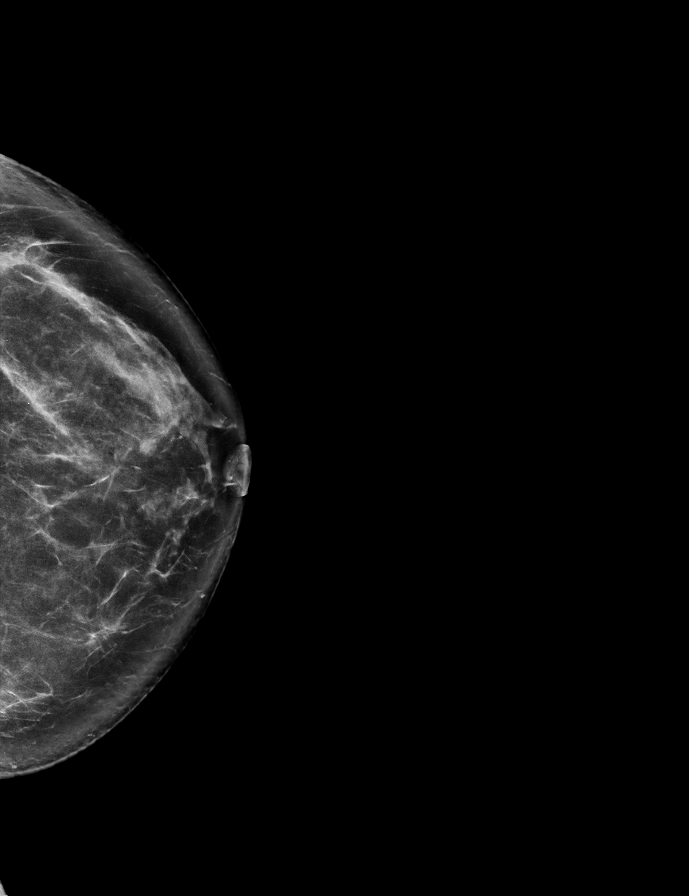

[R MLO synth-2D]
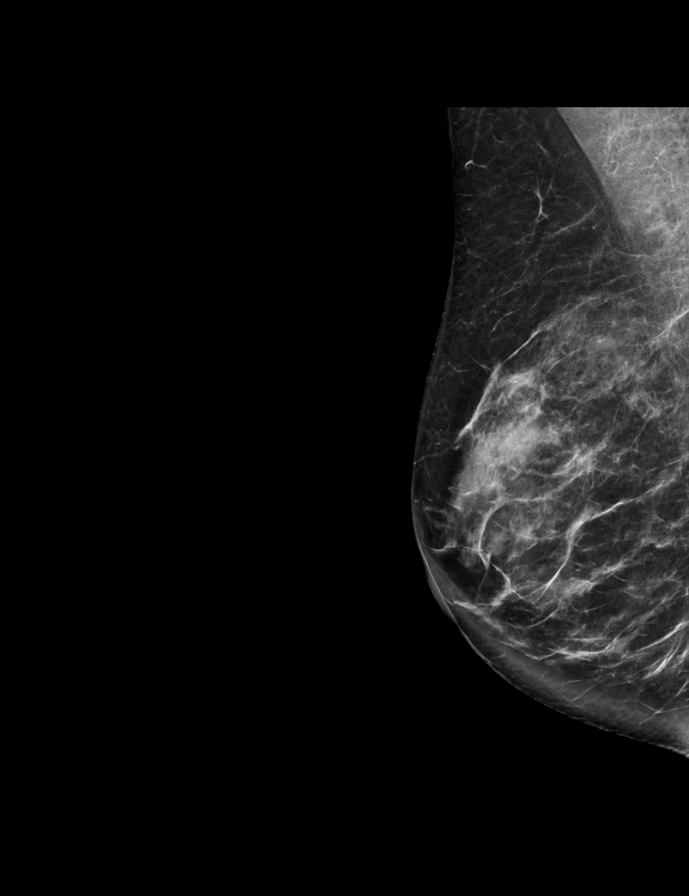

[R CC synth-2D]
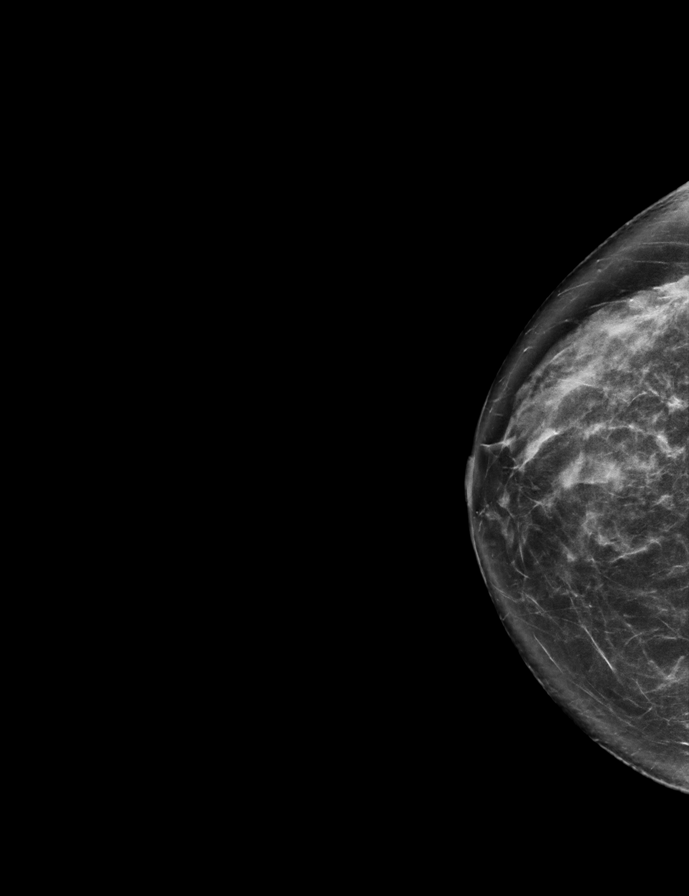

[L CC tomo · 2 of 77 frames shown]
[frame 25/77]
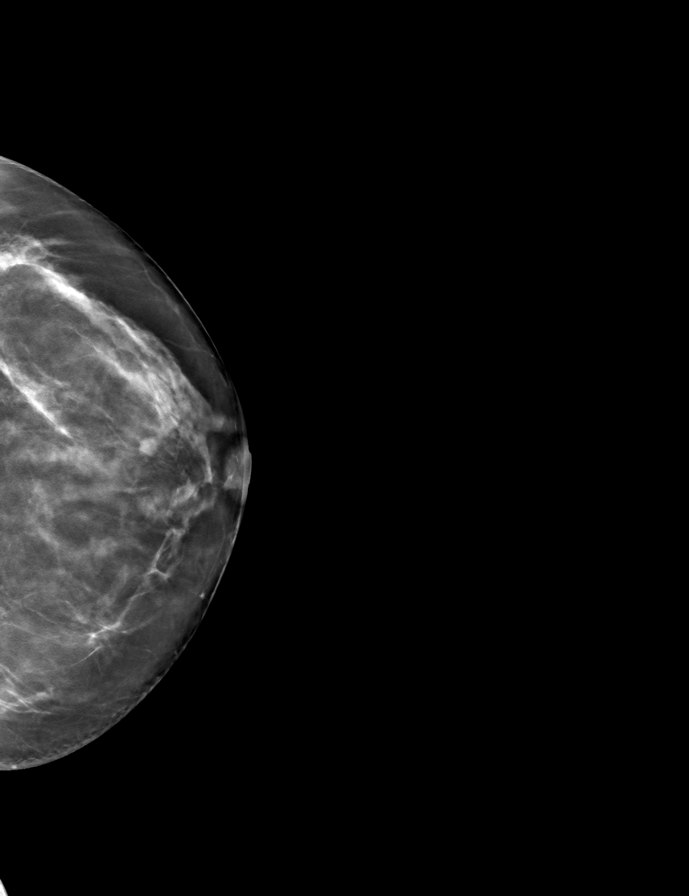
[frame 39/77]
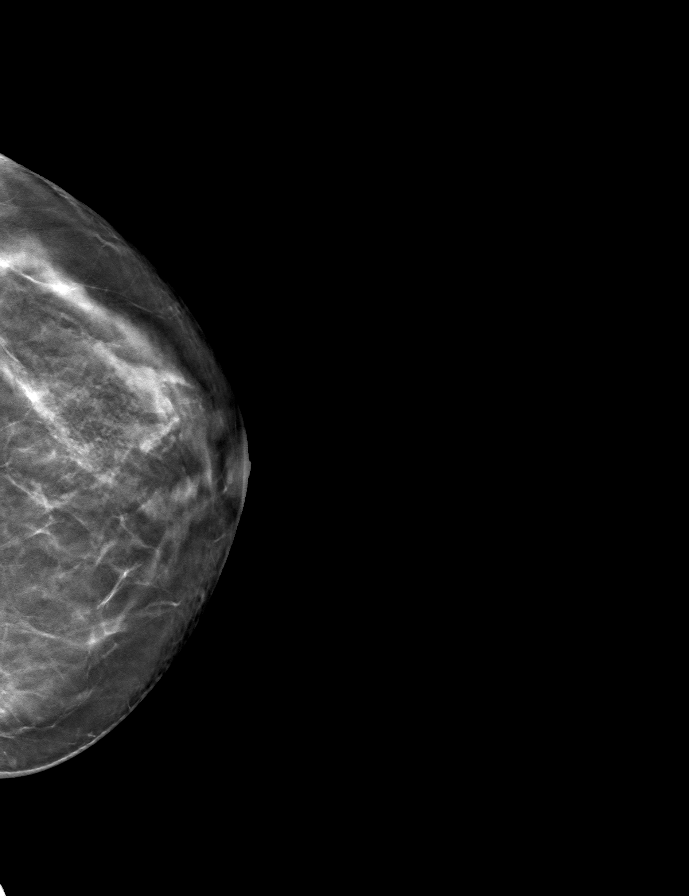

[R CC tomo · tomo slice 36/71.0]
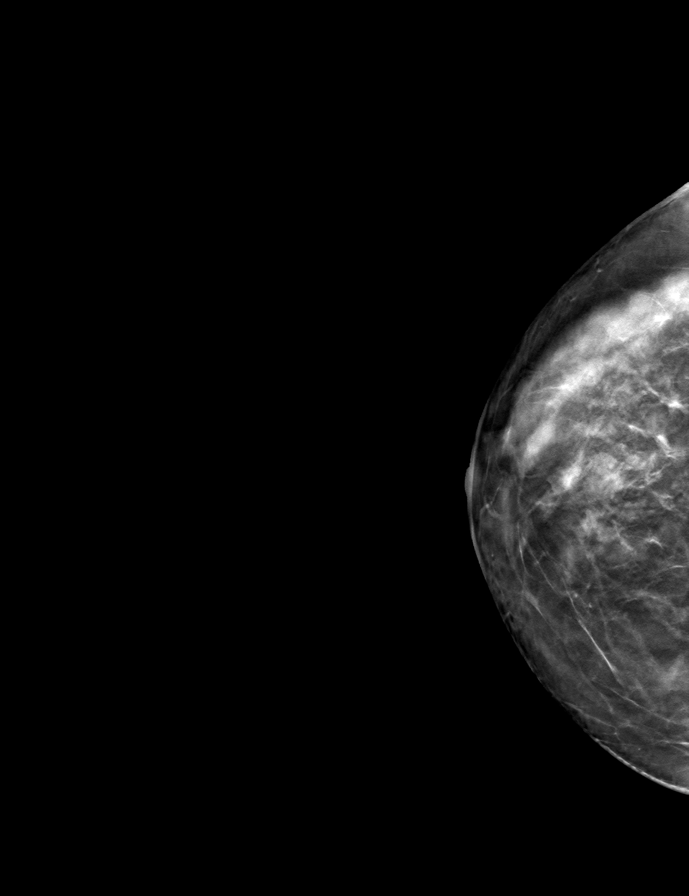

[L MLO tomo · tomo slice 40/79.0]
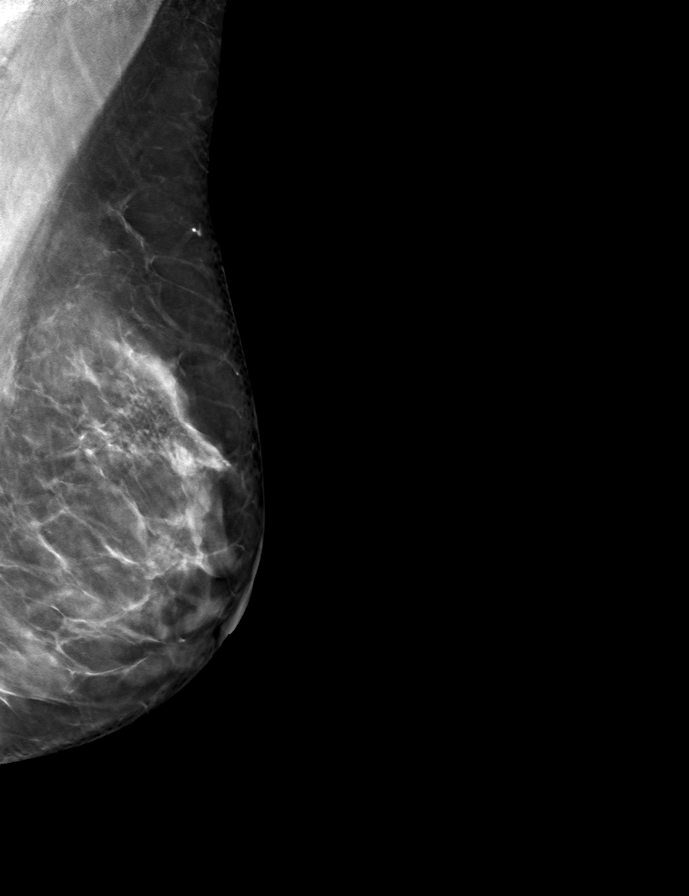

[R MLO tomo · tomo slice 35/70.0]
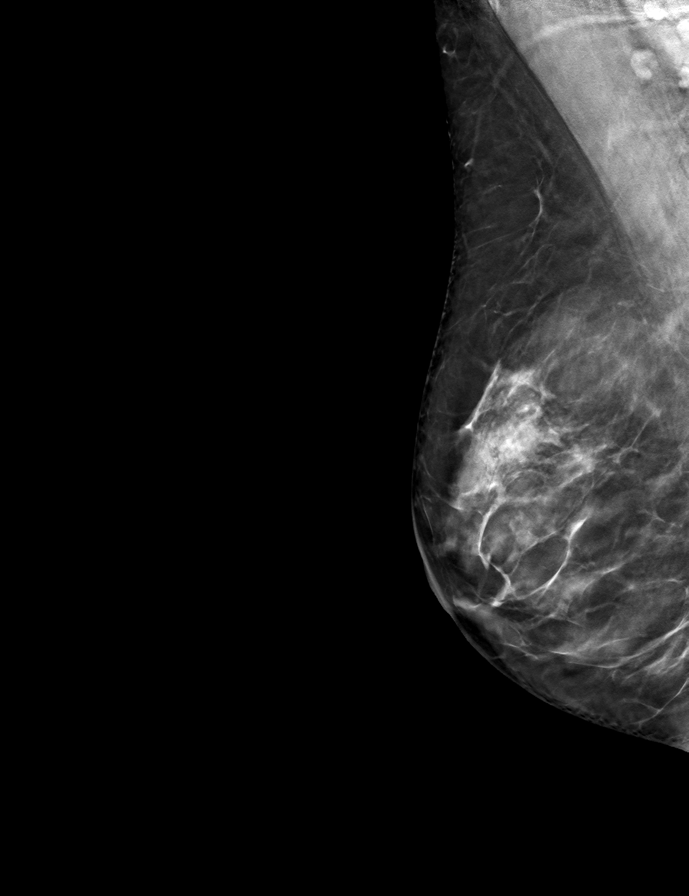

[9 of 24 positions shown; findings below may reference images not displayed]

ACR Breast Density Category c: The breast tissue is heterogeneously
dense, which may obscure small masses.
FINDINGS: There are no findings suspicious for malignancy.
IMPRESSION: No mammographic evidence of malignancy. A result letter of this
screening mammogram will be mailed directly to the patient.

RECOMMENDATION:
Screening mammogram in one year. (Code:Q3-W-BC3)

BI-RADS CATEGORY  1: Negative.

## 2021-10-14 DIAGNOSIS — G5602 Carpal tunnel syndrome, left upper limb: Secondary | ICD-10-CM | POA: Diagnosis not present

## 2021-10-14 DIAGNOSIS — M65322 Trigger finger, left index finger: Secondary | ICD-10-CM | POA: Diagnosis not present

## 2021-11-04 DIAGNOSIS — M65322 Trigger finger, left index finger: Secondary | ICD-10-CM | POA: Diagnosis not present

## 2021-11-04 DIAGNOSIS — G5602 Carpal tunnel syndrome, left upper limb: Secondary | ICD-10-CM | POA: Diagnosis not present

## 2021-11-16 DIAGNOSIS — G5602 Carpal tunnel syndrome, left upper limb: Secondary | ICD-10-CM | POA: Diagnosis not present

## 2021-11-16 DIAGNOSIS — M65322 Trigger finger, left index finger: Secondary | ICD-10-CM | POA: Diagnosis not present

## 2021-11-16 DIAGNOSIS — Z4789 Encounter for other orthopedic aftercare: Secondary | ICD-10-CM | POA: Diagnosis not present

## 2022-01-11 DIAGNOSIS — G5601 Carpal tunnel syndrome, right upper limb: Secondary | ICD-10-CM | POA: Diagnosis not present

## 2022-01-11 DIAGNOSIS — G5603 Carpal tunnel syndrome, bilateral upper limbs: Secondary | ICD-10-CM | POA: Diagnosis not present

## 2022-01-11 DIAGNOSIS — M65322 Trigger finger, left index finger: Secondary | ICD-10-CM | POA: Diagnosis not present

## 2022-02-11 DIAGNOSIS — R0981 Nasal congestion: Secondary | ICD-10-CM | POA: Diagnosis not present

## 2022-02-11 DIAGNOSIS — R059 Cough, unspecified: Secondary | ICD-10-CM | POA: Diagnosis not present

## 2022-02-11 DIAGNOSIS — Z03818 Encounter for observation for suspected exposure to other biological agents ruled out: Secondary | ICD-10-CM | POA: Diagnosis not present

## 2022-02-11 DIAGNOSIS — R509 Fever, unspecified: Secondary | ICD-10-CM | POA: Diagnosis not present

## 2022-02-16 DIAGNOSIS — J069 Acute upper respiratory infection, unspecified: Secondary | ICD-10-CM | POA: Diagnosis not present

## 2022-02-16 DIAGNOSIS — N3000 Acute cystitis without hematuria: Secondary | ICD-10-CM | POA: Diagnosis not present

## 2022-02-16 DIAGNOSIS — H1033 Unspecified acute conjunctivitis, bilateral: Secondary | ICD-10-CM | POA: Diagnosis not present

## 2022-02-18 DIAGNOSIS — G5603 Carpal tunnel syndrome, bilateral upper limbs: Secondary | ICD-10-CM | POA: Diagnosis not present

## 2022-03-09 DIAGNOSIS — Z13220 Encounter for screening for lipoid disorders: Secondary | ICD-10-CM | POA: Diagnosis not present

## 2022-03-09 DIAGNOSIS — Z79899 Other long term (current) drug therapy: Secondary | ICD-10-CM | POA: Diagnosis not present

## 2022-03-09 DIAGNOSIS — Z136 Encounter for screening for cardiovascular disorders: Secondary | ICD-10-CM | POA: Diagnosis not present

## 2022-03-21 ENCOUNTER — Ambulatory Visit
Admission: RE | Admit: 2022-03-21 | Discharge: 2022-03-21 | Disposition: A | Discharge status: Home / Self Care | Payer: PPO | Source: Ambulatory Visit | Attending: Family Medicine | Admitting: Family Medicine

## 2022-03-21 ENCOUNTER — Other Ambulatory Visit: Payer: Self-pay | Admitting: Family Medicine

## 2022-03-21 DIAGNOSIS — R059 Cough, unspecified: Secondary | ICD-10-CM | POA: Diagnosis not present

## 2022-03-21 DIAGNOSIS — R058 Other specified cough: Secondary | ICD-10-CM

## 2022-03-21 DIAGNOSIS — R0789 Other chest pain: Secondary | ICD-10-CM | POA: Diagnosis not present

## 2022-03-26 NOTE — Progress Notes (Signed)
Cardiology Office Note   Date:  03/28/2022   ID:  Brenda Ali, Brenda Ali December 17, 1955, MRN [id number]  PCP:  Mila Palmer, MD  Cardiologist:   Taleya Whitcher [place], MD   Chief Complaint  Patient presents with   Fatigue      History of Present Illness: DEJAHNAE Ali is a 66 y.o. female who is seen at the request of Garth Bigness MD for evaluation. She states she has had some issues and just wanted to be checked out. She has always been very active. Walks 5 miles/day at a brisk pace and plays tennis regularly. She reports for several months she has a recurrent cough that is off and on. Usually at night. She has also experienced some unusual fatigue. Never with exertion but she can be sitting and suddenly feel tired and can't keep her eyes open. She may take a nap which she had never done before. She has noted  some lightheadedness on occasion when doing normal chores or shopping. No chest pain. No dyspnea. Generally she has been very healthy. No history of DM or HTN. Nonsmoker.     Past Medical History:  Diagnosis Date   Anxiety 09/24/2019   Carpal tunnel syndrome on both sides 12/02/2019   Cervical radiculopathy 09/24/2019   Chronic idiopathic constipation 03/28/2022   Chronic interstitial cystitis 03/28/2022   DDD (degenerative disc disease), cervical 11/12/2019   Decreased estrogen level 03/28/2022   Endometriosis    Adherent R Ovary   Family history of factor V Leiden mutation 03/28/2022   History of fusion of cervical spine 09/24/2019   Infertility, female    Interstitial cystitis    LGSIL Pap smear of vagina 10/26/11   HPV Negative; Colpo Negative   Panic disorder    Raynaud phenomenon 03/28/2022   Sleep disorder 03/28/2022   Subserous leiomyoma of uterus 01/01/2016    Past Surgical History:  Procedure Laterality Date   carpel tunnel surgery     CERVICAL FUSION  06/04/2011   COLPOSCOPY  11/16/2011   Due to LGSIL - HPV;  benign   DIAGNOSTIC LAPAROSCOPY       Current Outpatient  Medications  Medication Sig Dispense Refill   clonazePAM (KLONOPIN) 0.5 MG tablet Take 0.5 mg by mouth daily.  0   gabapentin (NEURONTIN) 300 MG capsule Take 1 capsule (300 mg total) by mouth at bedtime. 90 capsule 4   No current facility-administered medications for this visit.    Allergies:   Penicillins and Sulfa antibiotics    Social History:  The patient  reports that she has never smoked. She has never used smokeless tobacco. She reports current alcohol use. She reports that she does not use drugs.   Family History:  The patient's family history includes Endometrial cancer (age of onset: 51) in her sister; Other in her brother.    ROS:  Please see the history of present illness.   Otherwise, review of systems are positive for none.   All other systems are reviewed and negative.    PHYSICAL EXAM: VS:  BP 100/68 (BP Location: Left Arm, Patient Position: Sitting, Cuff Size: Normal)   Pulse 64   Ht 5' 1.45" (1.561 m)   Wt 123 lb (55.8 kg)   LMP 10/13/2011   SpO2 96%   BMI 22.90 kg/m  , BMI Body mass index is 22.9 kg/m. GEN: Well nourished, well developed, in no acute distress HEENT: normal Neck: no JVD, carotid bruits, or masses Cardiac: RRR; no murmurs, rubs, or  gallops,no edema  Respiratory:  clear to auscultation bilaterally, normal work of breathing GI: soft, nontender, nondistended, + BS MS: no deformity or atrophy Skin: warm and dry, no rash Neuro:  Strength and sensation are intact Psych: euthymic mood, full affect   EKG:  EKG is ordered today. The ekg ordered today demonstrates NSR rate 64. Right axis. Otherwise normal. I have personally reviewed and interpreted this study.    Recent Labs: No results found for requested labs within last 365 days.    Lipid Panel    Component Value Date/Time   CHOL 243 (H) 07/01/2021 1112   TRIG 82 07/01/2021 1112   HDL 75 07/01/2021 1112   CHOLHDL 3.2 07/01/2021 1112   CHOLHDL 2.4 12/29/2016 0945   VLDL 17 12/29/2016  0945   LDLCALC 154 (H) 07/01/2021 1112     Dated 03/09/22: cholesterol 221, triglycerides 124, HDL 66, LDL 134. CBC, CMET and TSH normal.   Wt Readings from Last 3 Encounters:  03/28/22 123 lb (55.8 kg)  07/01/21 126 lb (57.2 kg)  05/19/20 125 lb (56.7 kg)      Other studies Reviewed: Additional studies/ records that were reviewed today include: none. Review of the above records demonstrates: N/A   ASSESSMENT AND PLAN:  1.  Symptoms of intermittent but persistent cough, fatigue at rest. Symptoms quite atypical for any cardiac issue since she is able to exercise at high level without any symptoms. Exam and Ecg are normal. Only CV risk factor is mild hypercholesterolemia. I reassured her that I felt her CV risk was very low and her symptoms were not really concerning for CAD or other cardiac issue. Encouraged her to continue with her active lifestyle and healthy diet. I don't feel further cardiac testing is indicated at this point and she was relieved.  2. Hypercholesterolemia    Current medicines are reviewed at length with the patient today.  The patient does not have concerns regarding medicines.  The following changes have been made:  no change  Labs/ tests ordered today include:  No orders of the defined types were placed in this encounter.        Disposition:   FU PRN  Signed, Drinda Belgard [place], MD  03/28/2022 1:21 PM    [facility] [address] [place], [place], [address] Phone [phone], Fax [phone]

## 2022-03-28 ENCOUNTER — Encounter: Payer: Self-pay | Admitting: *Deleted

## 2022-03-28 ENCOUNTER — Encounter: Payer: Self-pay | Admitting: Cardiology

## 2022-03-28 ENCOUNTER — Ambulatory Visit (INDEPENDENT_AMBULATORY_CARE_PROVIDER_SITE_OTHER): Payer: PPO | Admitting: Cardiology

## 2022-03-28 VITALS — BP 100/68 | HR 64 | Ht 61.45 in | Wt 123.0 lb

## 2022-03-28 DIAGNOSIS — I7300 Raynaud's syndrome without gangrene: Secondary | ICD-10-CM | POA: Insufficient documentation

## 2022-03-28 DIAGNOSIS — E2839 Other primary ovarian failure: Secondary | ICD-10-CM | POA: Insufficient documentation

## 2022-03-28 DIAGNOSIS — E7800 Pure hypercholesterolemia, unspecified: Secondary | ICD-10-CM

## 2022-03-28 DIAGNOSIS — R5383 Other fatigue: Secondary | ICD-10-CM

## 2022-03-28 DIAGNOSIS — R053 Chronic cough: Secondary | ICD-10-CM | POA: Diagnosis not present

## 2022-03-28 DIAGNOSIS — Z832 Family history of diseases of the blood and blood-forming organs and certain disorders involving the immune mechanism: Secondary | ICD-10-CM

## 2022-03-28 DIAGNOSIS — K5904 Chronic idiopathic constipation: Secondary | ICD-10-CM | POA: Insufficient documentation

## 2022-03-28 DIAGNOSIS — N3010 Interstitial cystitis (chronic) without hematuria: Secondary | ICD-10-CM | POA: Insufficient documentation

## 2022-03-28 DIAGNOSIS — G479 Sleep disorder, unspecified: Secondary | ICD-10-CM | POA: Insufficient documentation

## 2022-03-28 HISTORY — DX: Interstitial cystitis (chronic) without hematuria: N30.10

## 2022-03-28 HISTORY — DX: Other primary ovarian failure: E28.39

## 2022-03-28 HISTORY — DX: Chronic idiopathic constipation: K59.04

## 2022-03-28 HISTORY — DX: Raynaud's syndrome without gangrene: I73.00

## 2022-03-28 HISTORY — DX: Sleep disorder, unspecified: G47.9

## 2022-03-28 HISTORY — DX: Family history of diseases of the blood and blood-forming organs and certain disorders involving the immune mechanism: Z83.2

## 2022-04-14 DIAGNOSIS — H5212 Myopia, left eye: Secondary | ICD-10-CM | POA: Diagnosis not present

## 2022-04-14 DIAGNOSIS — H2513 Age-related nuclear cataract, bilateral: Secondary | ICD-10-CM | POA: Diagnosis not present

## 2022-04-14 DIAGNOSIS — H524 Presbyopia: Secondary | ICD-10-CM | POA: Diagnosis not present

## 2022-04-14 DIAGNOSIS — H04123 Dry eye syndrome of bilateral lacrimal glands: Secondary | ICD-10-CM | POA: Diagnosis not present

## 2022-04-22 DIAGNOSIS — G5601 Carpal tunnel syndrome, right upper limb: Secondary | ICD-10-CM | POA: Diagnosis not present

## 2022-04-22 DIAGNOSIS — M65322 Trigger finger, left index finger: Secondary | ICD-10-CM | POA: Diagnosis not present

## 2022-04-22 DIAGNOSIS — G5602 Carpal tunnel syndrome, left upper limb: Secondary | ICD-10-CM | POA: Diagnosis not present

## 2022-05-24 ENCOUNTER — Other Ambulatory Visit: Payer: Self-pay | Admitting: Obstetrics & Gynecology

## 2022-05-24 ENCOUNTER — Other Ambulatory Visit (HOSPITAL_BASED_OUTPATIENT_CLINIC_OR_DEPARTMENT_OTHER): Payer: Self-pay | Admitting: Obstetrics & Gynecology

## 2022-05-24 DIAGNOSIS — M8580 Other specified disorders of bone density and structure, unspecified site: Secondary | ICD-10-CM

## 2022-05-24 DIAGNOSIS — Z1231 Encounter for screening mammogram for malignant neoplasm of breast: Secondary | ICD-10-CM

## 2022-06-01 DIAGNOSIS — N390 Urinary tract infection, site not specified: Secondary | ICD-10-CM | POA: Diagnosis not present

## 2022-06-30 ENCOUNTER — Ambulatory Visit: Payer: PPO

## 2022-07-06 DIAGNOSIS — M79671 Pain in right foot: Secondary | ICD-10-CM | POA: Diagnosis not present

## 2022-07-06 DIAGNOSIS — M19071 Primary osteoarthritis, right ankle and foot: Secondary | ICD-10-CM | POA: Diagnosis not present

## 2022-07-25 ENCOUNTER — Ambulatory Visit
Admission: RE | Admit: 2022-07-25 | Discharge: 2022-07-25 | Disposition: A | Discharge status: Home / Self Care | Payer: PPO | Source: Ambulatory Visit | Attending: Obstetrics & Gynecology | Admitting: Obstetrics & Gynecology

## 2022-07-25 DIAGNOSIS — Z1231 Encounter for screening mammogram for malignant neoplasm of breast: Secondary | ICD-10-CM

## 2022-07-26 ENCOUNTER — Encounter (HOSPITAL_BASED_OUTPATIENT_CLINIC_OR_DEPARTMENT_OTHER): Payer: Self-pay | Admitting: Obstetrics & Gynecology

## 2022-07-26 ENCOUNTER — Ambulatory Visit (INDEPENDENT_AMBULATORY_CARE_PROVIDER_SITE_OTHER): Payer: PPO | Admitting: Obstetrics & Gynecology

## 2022-07-26 VITALS — BP 110/68 | HR 75 | Ht 61.0 in | Wt 126.8 lb

## 2022-07-26 DIAGNOSIS — Z01419 Encounter for gynecological examination (general) (routine) without abnormal findings: Secondary | ICD-10-CM

## 2022-07-26 DIAGNOSIS — Z832 Family history of diseases of the blood and blood-forming organs and certain disorders involving the immune mechanism: Secondary | ICD-10-CM | POA: Diagnosis not present

## 2022-07-26 DIAGNOSIS — D252 Subserosal leiomyoma of uterus: Secondary | ICD-10-CM

## 2022-07-26 DIAGNOSIS — R232 Flushing: Secondary | ICD-10-CM

## 2022-07-26 MED ORDER — GABAPENTIN 300 MG PO CAPS: 300.0000 mg | ORAL_CAPSULE | Freq: Every day | ORAL | 4 refills | Status: DC

## 2022-07-26 NOTE — Progress Notes (Signed)
66 y.o. G83P1011 Married White or Caucasian female here for breast and pelvic exam.  Doing well.  Denies vaginal bleeding.  Has used gabapentin for hot flashes.  Still taking '300mg'$  nightly.  Does need a refill.  Sister has new breast cancer diagnosis.  Had mastectomy with LND.  Had 5 positive lymph nodes.  Patient's last menstrual period was 10/13/2011.          Sexually active: No.  H/O STD:  no  Health Maintenance: PCP:  Lindell Noe.  Last wellness appt was August.  Did blood work at that appt:  yes Vaccines are up to date:  discussed today.  Has not shingrix. Colonoscopy:  10/06/2017, follow up 10 years MMG:  done yesterday BMD:  scheduled 11/07/2022 Last pap smear:  07/01/2021 Negative.   H/o abnormal pap smear:  remote hx   reports that she has never smoked. She has never used smokeless tobacco. She reports current alcohol use. She reports that she does not use drugs.  Past Medical History:  Diagnosis Date   Anxiety 09/24/2019   Carpal tunnel syndrome on both sides 12/02/2019   Cervical radiculopathy 09/24/2019   Chronic idiopathic constipation 03/28/2022   Chronic interstitial cystitis 03/28/2022   DDD (degenerative disc disease), cervical 11/12/2019   Decreased estrogen level 03/28/2022   Endometriosis    Adherent R Ovary   Family history of factor V Leiden mutation 03/28/2022   History of fusion of cervical spine 09/24/2019   Infertility, female    Interstitial cystitis    LGSIL Pap smear of vagina 10/26/11   HPV Negative; Colpo Negative   Panic disorder    Raynaud phenomenon 03/28/2022   Sleep disorder 03/28/2022   Subserous leiomyoma of uterus 01/01/2016    Past Surgical History:  Procedure Laterality Date   carpel tunnel surgery     CERVICAL FUSION  06/04/2011   COLPOSCOPY  11/16/2011   Due to LGSIL - HPV;  benign   DIAGNOSTIC LAPAROSCOPY      Current Outpatient Medications  Medication Sig Dispense Refill   clonazePAM (KLONOPIN) 0.5 MG tablet Take 0.5 mg by mouth  daily.  0   naproxen (NAPROSYN) 500 MG tablet Take 500 mg by mouth 2 (two) times daily with a meal.     gabapentin (NEURONTIN) 300 MG capsule Take 1 capsule (300 mg total) by mouth at bedtime. 90 capsule 4   No current facility-administered medications for this visit.    Family History  Problem Relation Age of Onset   Breast cancer Sister    Endometrial cancer Sister 26   Other Brother        early onset of alzheimers    Review of Systems  Constitutional: Negative.   Genitourinary: Negative.     Exam:   BP 110/68 (BP Location: Right Arm, Patient Position: Sitting, Cuff Size: Large)   Pulse 75   Ht '5\' 1"'$  (1.549 m) Comment: Reported  Wt 126 lb 12.8 oz (57.5 kg)   LMP 10/13/2011   BMI 23.96 kg/m   Height: '5\' 1"'$  (154.9 cm) (Reported)  General appearance: alert, cooperative and appears stated age Breasts: normal appearance, no masses or tenderness Abdomen: soft, non-tender; bowel sounds normal; no masses,  no organomegaly Lymph nodes: Cervical, supraclavicular, and axillary nodes normal.  No abnormal inguinal nodes palpated Neurologic: Grossly normal  Pelvic: External genitalia:  no lesions              Urethra:  normal appearing urethra with no masses, tenderness or lesions  Bartholins and Skenes: normal                 Vagina: normal appearing vagina with atrophic changes and no discharge, no lesions              Cervix: no lesions              Pap taken: No. Bimanual Exam:  Uterus:  normal size, contour, position, consistency, mobility, non-tender              Adnexa: normal adnexa and no mass, fullness, tenderness               Rectovaginal: Confirms               Anus:  normal sphincter tone, no lesions  Chaperone, Octaviano Batty, CMA, was present for exam.  Assessment/Plan: 1. Encntr for gyn exam (general) (routine) w/o abn findings - Pap smear neg 2022 - Mammogram done yesterday - Colonoscopy 2019, follow up 10 years - Bone mineral density 11/2022 -  lab work done done with PCP - vaccines reviewed/updated  2. Hot flashes - gabapentin (NEURONTIN) 300 MG capsule; Take 1 capsule (300 mg total) by mouth at bedtime.  Dispense: 90 capsule; Refill: 4  3. Subserous leiomyoma of uterus - stable exam  4. Family history of factor V Leiden mutation  5.  Family history of breast cancer, sister, recent diagnosis

## 2022-08-31 DIAGNOSIS — E2839 Other primary ovarian failure: Secondary | ICD-10-CM | POA: Diagnosis not present

## 2022-08-31 DIAGNOSIS — Z23 Encounter for immunization: Secondary | ICD-10-CM | POA: Diagnosis not present

## 2022-08-31 DIAGNOSIS — Z79899 Other long term (current) drug therapy: Secondary | ICD-10-CM | POA: Diagnosis not present

## 2022-08-31 DIAGNOSIS — Z0000 Encounter for general adult medical examination without abnormal findings: Secondary | ICD-10-CM | POA: Diagnosis not present

## 2022-08-31 DIAGNOSIS — E7800 Pure hypercholesterolemia, unspecified: Secondary | ICD-10-CM | POA: Diagnosis not present

## 2022-08-31 DIAGNOSIS — G479 Sleep disorder, unspecified: Secondary | ICD-10-CM | POA: Diagnosis not present

## 2022-09-29 DIAGNOSIS — G5601 Carpal tunnel syndrome, right upper limb: Secondary | ICD-10-CM | POA: Diagnosis not present

## 2022-10-03 HISTORY — PX: OTHER SURGICAL HISTORY: SHX169

## 2022-11-01 DIAGNOSIS — R946 Abnormal results of thyroid function studies: Secondary | ICD-10-CM | POA: Diagnosis not present

## 2022-11-03 DIAGNOSIS — R42 Dizziness and giddiness: Secondary | ICD-10-CM | POA: Diagnosis not present

## 2022-11-03 DIAGNOSIS — R946 Abnormal results of thyroid function studies: Secondary | ICD-10-CM | POA: Diagnosis not present

## 2022-11-03 DIAGNOSIS — G479 Sleep disorder, unspecified: Secondary | ICD-10-CM | POA: Diagnosis not present

## 2022-11-07 ENCOUNTER — Ambulatory Visit
Admission: RE | Admit: 2022-11-07 | Discharge: 2022-11-07 | Disposition: A | Discharge status: Home / Self Care | Payer: PPO | Source: Ambulatory Visit | Attending: Obstetrics & Gynecology | Admitting: Obstetrics & Gynecology

## 2022-11-07 DIAGNOSIS — M85851 Other specified disorders of bone density and structure, right thigh: Secondary | ICD-10-CM | POA: Diagnosis not present

## 2022-11-07 DIAGNOSIS — M810 Age-related osteoporosis without current pathological fracture: Secondary | ICD-10-CM | POA: Diagnosis not present

## 2022-11-07 DIAGNOSIS — M8580 Other specified disorders of bone density and structure, unspecified site: Secondary | ICD-10-CM

## 2022-11-07 DIAGNOSIS — Z780 Asymptomatic menopausal state: Secondary | ICD-10-CM | POA: Diagnosis not present

## 2022-12-02 DIAGNOSIS — G5601 Carpal tunnel syndrome, right upper limb: Secondary | ICD-10-CM | POA: Diagnosis not present

## 2022-12-13 DIAGNOSIS — G5601 Carpal tunnel syndrome, right upper limb: Secondary | ICD-10-CM | POA: Diagnosis not present

## 2023-01-04 DIAGNOSIS — R031 Nonspecific low blood-pressure reading: Secondary | ICD-10-CM | POA: Diagnosis not present

## 2023-01-04 DIAGNOSIS — R051 Acute cough: Secondary | ICD-10-CM | POA: Diagnosis not present

## 2023-01-04 DIAGNOSIS — J069 Acute upper respiratory infection, unspecified: Secondary | ICD-10-CM | POA: Diagnosis not present

## 2023-01-04 DIAGNOSIS — Z03818 Encounter for observation for suspected exposure to other biological agents ruled out: Secondary | ICD-10-CM | POA: Diagnosis not present

## 2023-02-01 DIAGNOSIS — R946 Abnormal results of thyroid function studies: Secondary | ICD-10-CM | POA: Diagnosis not present

## 2023-02-01 DIAGNOSIS — R7989 Other specified abnormal findings of blood chemistry: Secondary | ICD-10-CM | POA: Diagnosis not present

## 2023-02-03 DIAGNOSIS — R946 Abnormal results of thyroid function studies: Secondary | ICD-10-CM | POA: Diagnosis not present

## 2023-02-03 DIAGNOSIS — G479 Sleep disorder, unspecified: Secondary | ICD-10-CM | POA: Diagnosis not present

## 2023-02-03 DIAGNOSIS — F419 Anxiety disorder, unspecified: Secondary | ICD-10-CM | POA: Diagnosis not present

## 2023-03-15 DIAGNOSIS — M19071 Primary osteoarthritis, right ankle and foot: Secondary | ICD-10-CM | POA: Diagnosis not present

## 2023-03-17 DIAGNOSIS — R946 Abnormal results of thyroid function studies: Secondary | ICD-10-CM | POA: Diagnosis not present

## 2023-03-28 DIAGNOSIS — G5601 Carpal tunnel syndrome, right upper limb: Secondary | ICD-10-CM | POA: Diagnosis not present

## 2023-04-04 DIAGNOSIS — M2011 Hallux valgus (acquired), right foot: Secondary | ICD-10-CM | POA: Diagnosis not present

## 2023-04-04 DIAGNOSIS — M2021 Hallux rigidus, right foot: Secondary | ICD-10-CM | POA: Diagnosis not present

## 2023-05-10 DIAGNOSIS — H2513 Age-related nuclear cataract, bilateral: Secondary | ICD-10-CM | POA: Diagnosis not present

## 2023-05-10 DIAGNOSIS — H52203 Unspecified astigmatism, bilateral: Secondary | ICD-10-CM | POA: Diagnosis not present

## 2023-05-31 DIAGNOSIS — R053 Chronic cough: Secondary | ICD-10-CM | POA: Diagnosis not present

## 2023-05-31 DIAGNOSIS — Z03818 Encounter for observation for suspected exposure to other biological agents ruled out: Secondary | ICD-10-CM | POA: Diagnosis not present

## 2023-05-31 DIAGNOSIS — J069 Acute upper respiratory infection, unspecified: Secondary | ICD-10-CM | POA: Diagnosis not present

## 2023-05-31 DIAGNOSIS — J029 Acute pharyngitis, unspecified: Secondary | ICD-10-CM | POA: Diagnosis not present

## 2023-06-23 ENCOUNTER — Other Ambulatory Visit: Payer: Self-pay | Admitting: Obstetrics & Gynecology

## 2023-06-23 DIAGNOSIS — Z1231 Encounter for screening mammogram for malignant neoplasm of breast: Secondary | ICD-10-CM

## 2023-08-01 ENCOUNTER — Ambulatory Visit
Admission: RE | Admit: 2023-08-01 | Discharge: 2023-08-01 | Disposition: A | Discharge status: Home / Self Care | Payer: PPO | Source: Ambulatory Visit | Attending: Obstetrics & Gynecology | Admitting: Obstetrics & Gynecology

## 2023-08-01 DIAGNOSIS — Z1231 Encounter for screening mammogram for malignant neoplasm of breast: Secondary | ICD-10-CM | POA: Diagnosis not present

## 2023-09-04 ENCOUNTER — Ambulatory Visit (HOSPITAL_BASED_OUTPATIENT_CLINIC_OR_DEPARTMENT_OTHER): Payer: PPO | Admitting: Obstetrics & Gynecology

## 2023-09-04 ENCOUNTER — Other Ambulatory Visit (HOSPITAL_COMMUNITY)
Admission: RE | Admit: 2023-09-04 | Discharge: 2023-09-04 | Disposition: A | Discharge status: Home / Self Care | Payer: PPO | Source: Ambulatory Visit | Attending: Obstetrics & Gynecology | Admitting: Obstetrics & Gynecology

## 2023-09-04 ENCOUNTER — Encounter (HOSPITAL_BASED_OUTPATIENT_CLINIC_OR_DEPARTMENT_OTHER): Payer: Self-pay | Admitting: Obstetrics & Gynecology

## 2023-09-04 VITALS — BP 115/66 | HR 65 | Ht 62.0 in | Wt 131.2 lb

## 2023-09-04 DIAGNOSIS — Z832 Family history of diseases of the blood and blood-forming organs and certain disorders involving the immune mechanism: Secondary | ICD-10-CM

## 2023-09-04 DIAGNOSIS — Z124 Encounter for screening for malignant neoplasm of cervix: Secondary | ICD-10-CM | POA: Diagnosis not present

## 2023-09-04 DIAGNOSIS — D252 Subserosal leiomyoma of uterus: Secondary | ICD-10-CM | POA: Diagnosis not present

## 2023-09-04 DIAGNOSIS — Z01419 Encounter for gynecological examination (general) (routine) without abnormal findings: Secondary | ICD-10-CM | POA: Diagnosis not present

## 2023-09-04 DIAGNOSIS — R232 Flushing: Secondary | ICD-10-CM | POA: Diagnosis not present

## 2023-09-04 MED ORDER — GABAPENTIN 300 MG PO CAPS: 300.0000 mg | ORAL_CAPSULE | Freq: Every day | ORAL | 4 refills | Status: DC

## 2023-09-04 NOTE — Progress Notes (Unsigned)
67 y.o. G20P1011 Married White or Caucasian female here for breast and pelvic exam.  I am also following her for hot flashes/PMP status.  On gabapentin 300mg  nightly.  Denies vaginal bleeding.   Had trigger finger and carpel tunnel surgery.    Sister was diagnosed last year with breast cancer.  Had mastectomy and + lymph nodes.  She has chemotherapy and radiation.  Pt is unsure if she had genetic testing.  Sister also had endometrial cancer about 10 years ago.    Patient's last menstrual period was 10/13/2011.          Sexually active: No.   He had prostate cancer.   H/O STD:  no  Health Maintenance: PCP:  Dr. Chanetta Marshall.  Appt is scheduled next week.     Vaccines are up to date:  has not done shingles Colonoscopy:  10/06/2017, follow up 10 years MMG:  08/01/2023 Negative BMD:  11/07/2022 Osteopenia in te hips, osteoporosis in the arm Last pap smear:  07/01/2021 negative.   H/o abnormal pap smear:  remote hx    reports that she has never smoked. She has never used smokeless tobacco. She reports current alcohol use. She reports that she does not use drugs.  Past Medical History:  Diagnosis Date  . Anxiety 09/24/2019  . Carpal tunnel syndrome on both sides 12/02/2019  . Cervical radiculopathy 09/24/2019  . Chronic idiopathic constipation 03/28/2022  . Chronic interstitial cystitis 03/28/2022  . DDD (degenerative disc disease), cervical 11/12/2019  . Decreased estrogen level 03/28/2022  . Endometriosis    Adherent R Ovary  . Family history of factor V Leiden mutation 03/28/2022  . History of fusion of cervical spine 09/24/2019  . Infertility, female   . Interstitial cystitis   . LGSIL Pap smear of vagina 10/26/11   HPV Negative; Colpo Negative  . Panic disorder   . Raynaud phenomenon 03/28/2022  . Sleep disorder 03/28/2022  . Subserous leiomyoma of uterus 01/01/2016    Past Surgical History:  Procedure Laterality Date  . carpel tunnel surgery    . CERVICAL FUSION  06/04/2011  .  COLPOSCOPY  11/16/2011   Due to LGSIL - HPV;  benign  . DIAGNOSTIC LAPAROSCOPY      Current Outpatient Medications  Medication Sig Dispense Refill  . gabapentin (NEURONTIN) 300 MG capsule Take 1 capsule (300 mg total) by mouth at bedtime. 90 capsule 4  . levothyroxine (SYNTHROID) 100 MCG tablet      No current facility-administered medications for this visit.    Family History  Problem Relation Age of Onset  . Breast cancer Sister 9       genetic testing pending  . Endometrial cancer Sister 15  . Other Brother        early onset of alzheimers    Review of Systems  Constitutional: Negative.   Genitourinary: Negative.     Exam:   BP 115/66 (BP Location: Left Arm, Patient Position: Sitting, Cuff Size: Normal)   Pulse 65   Ht 5\' 2"  (1.575 m)   Wt 131 lb 3.2 oz (59.5 kg)   LMP 10/13/2011   BMI 24.00 kg/m   Height: 5\' 2"  (157.5 cm)  General appearance: alert, cooperative and appears stated age Breasts: normal appearance, no masses or tenderness Abdomen: soft, non-tender; bowel sounds normal; no masses,  no organomegaly Lymph nodes: Cervical, supraclavicular, and axillary nodes normal.  No abnormal inguinal nodes palpated Neurologic: Grossly normal  Pelvic: External genitalia:  no lesions  Urethra:  normal appearing urethra with no masses, tenderness or lesions              Bartholins and Skenes: normal                 Vagina: normal appearing vagina with atrophic changes and no discharge, no lesions              Cervix: no lesions              Pap taken: Yes.   Bimanual Exam:  Uterus:  normal size, contour, position, consistency, mobility, non-tender              Adnexa: normal adnexa and no mass, fullness, tenderness               Rectovaginal: Confirms               Anus:  normal sphincter tone, no lesions  Chaperone, Ina Homes, CMA, was present for exam.  Assessment/Plan: 1. Encntr for gyn exam (general) (routine) w/o abn findings - Pap smear  obtained today - Mammogram *** - Colonoscopy *** - Bone mineral density *** - lab work done with PCP, Dr. Marland Kitchen - vaccines reviewed/updated  2. Hot flashes *** - gabapentin (NEURONTIN) 300 MG capsule; Take 1 capsule (300 mg total) by mouth at bedtime.  Dispense: 90 capsule; Refill: 4  3. Cervical cancer screening *** - Cytology - PAP( [facility]) - PR OBTAINING SCREEN PAP SMEAR  4. Family history of factor V Leiden mutation ***  5. Subserous leiomyoma of uterus ***

## 2023-09-06 LAB — CYTOLOGY - PAP: Diagnosis: NEGATIVE

## 2023-09-07 ENCOUNTER — Encounter (HOSPITAL_BASED_OUTPATIENT_CLINIC_OR_DEPARTMENT_OTHER): Payer: Self-pay | Admitting: Obstetrics & Gynecology

## 2023-09-07 DIAGNOSIS — M2011 Hallux valgus (acquired), right foot: Secondary | ICD-10-CM | POA: Diagnosis not present

## 2023-09-07 DIAGNOSIS — M2021 Hallux rigidus, right foot: Secondary | ICD-10-CM | POA: Diagnosis not present

## 2023-09-14 DIAGNOSIS — E7800 Pure hypercholesterolemia, unspecified: Secondary | ICD-10-CM | POA: Diagnosis not present

## 2023-09-14 DIAGNOSIS — G479 Sleep disorder, unspecified: Secondary | ICD-10-CM | POA: Diagnosis not present

## 2023-09-14 DIAGNOSIS — R7989 Other specified abnormal findings of blood chemistry: Secondary | ICD-10-CM | POA: Diagnosis not present

## 2023-09-14 DIAGNOSIS — E559 Vitamin D deficiency, unspecified: Secondary | ICD-10-CM | POA: Diagnosis not present

## 2023-09-14 DIAGNOSIS — Z23 Encounter for immunization: Secondary | ICD-10-CM | POA: Diagnosis not present

## 2023-09-14 DIAGNOSIS — Z79899 Other long term (current) drug therapy: Secondary | ICD-10-CM | POA: Diagnosis not present

## 2023-09-14 DIAGNOSIS — Z0000 Encounter for general adult medical examination without abnormal findings: Secondary | ICD-10-CM | POA: Diagnosis not present

## 2023-09-14 DIAGNOSIS — E2839 Other primary ovarian failure: Secondary | ICD-10-CM | POA: Diagnosis not present

## 2023-12-12 DIAGNOSIS — M542 Cervicalgia: Secondary | ICD-10-CM | POA: Diagnosis not present

## 2023-12-27 DIAGNOSIS — M542 Cervicalgia: Secondary | ICD-10-CM | POA: Diagnosis not present

## 2023-12-28 DIAGNOSIS — M542 Cervicalgia: Secondary | ICD-10-CM | POA: Diagnosis not present

## 2024-01-19 DIAGNOSIS — M542 Cervicalgia: Secondary | ICD-10-CM | POA: Diagnosis not present

## 2024-01-29 DIAGNOSIS — M5412 Radiculopathy, cervical region: Secondary | ICD-10-CM | POA: Diagnosis not present

## 2024-01-29 DIAGNOSIS — M4800 Spinal stenosis, site unspecified: Secondary | ICD-10-CM | POA: Diagnosis not present

## 2024-05-27 DIAGNOSIS — D2272 Melanocytic nevi of left lower limb, including hip: Secondary | ICD-10-CM | POA: Diagnosis not present

## 2024-05-27 DIAGNOSIS — D2261 Melanocytic nevi of right upper limb, including shoulder: Secondary | ICD-10-CM | POA: Diagnosis not present

## 2024-05-27 DIAGNOSIS — I788 Other diseases of capillaries: Secondary | ICD-10-CM | POA: Diagnosis not present

## 2024-05-27 DIAGNOSIS — D2271 Melanocytic nevi of right lower limb, including hip: Secondary | ICD-10-CM | POA: Diagnosis not present

## 2024-05-27 DIAGNOSIS — L821 Other seborrheic keratosis: Secondary | ICD-10-CM | POA: Diagnosis not present

## 2024-05-27 DIAGNOSIS — D225 Melanocytic nevi of trunk: Secondary | ICD-10-CM | POA: Diagnosis not present

## 2024-05-27 DIAGNOSIS — D1801 Hemangioma of skin and subcutaneous tissue: Secondary | ICD-10-CM | POA: Diagnosis not present

## 2024-07-31 DIAGNOSIS — H524 Presbyopia: Secondary | ICD-10-CM | POA: Diagnosis not present

## 2024-07-31 DIAGNOSIS — H2513 Age-related nuclear cataract, bilateral: Secondary | ICD-10-CM | POA: Diagnosis not present

## 2024-08-08 ENCOUNTER — Other Ambulatory Visit: Payer: Self-pay | Admitting: Obstetrics & Gynecology

## 2024-08-08 DIAGNOSIS — Z1231 Encounter for screening mammogram for malignant neoplasm of breast: Secondary | ICD-10-CM

## 2024-09-05 ENCOUNTER — Ambulatory Visit: Admission: RE | Admit: 2024-09-05 | Discharge: 2024-09-05 | Disposition: A | Source: Ambulatory Visit

## 2024-09-05 DIAGNOSIS — Z1231 Encounter for screening mammogram for malignant neoplasm of breast: Secondary | ICD-10-CM | POA: Diagnosis not present

## 2024-09-12 DIAGNOSIS — Z79899 Other long term (current) drug therapy: Secondary | ICD-10-CM | POA: Diagnosis not present

## 2024-09-12 DIAGNOSIS — E7800 Pure hypercholesterolemia, unspecified: Secondary | ICD-10-CM | POA: Diagnosis not present

## 2024-09-12 DIAGNOSIS — G479 Sleep disorder, unspecified: Secondary | ICD-10-CM | POA: Diagnosis not present

## 2024-09-12 DIAGNOSIS — R7989 Other specified abnormal findings of blood chemistry: Secondary | ICD-10-CM | POA: Diagnosis not present

## 2024-09-12 DIAGNOSIS — Z0000 Encounter for general adult medical examination without abnormal findings: Secondary | ICD-10-CM | POA: Diagnosis not present

## 2024-09-12 DIAGNOSIS — E559 Vitamin D deficiency, unspecified: Secondary | ICD-10-CM | POA: Diagnosis not present

## 2024-09-12 DIAGNOSIS — Z881 Allergy status to other antibiotic agents status: Secondary | ICD-10-CM | POA: Diagnosis not present

## 2024-09-21 ENCOUNTER — Other Ambulatory Visit (HOSPITAL_BASED_OUTPATIENT_CLINIC_OR_DEPARTMENT_OTHER): Payer: Self-pay | Admitting: Obstetrics & Gynecology

## 2024-09-21 DIAGNOSIS — R232 Flushing: Secondary | ICD-10-CM

## 2024-11-20 ENCOUNTER — Ambulatory Visit (HOSPITAL_BASED_OUTPATIENT_CLINIC_OR_DEPARTMENT_OTHER): Admitting: Obstetrics & Gynecology
